# Patient Record
Sex: Male | Born: 1950 | Race: Black or African American | Hispanic: No | Smoking: Heavy tobacco smoker
Health system: Southern US, Community
[De-identification: ages and names within clinical notes are randomized; demographics above are authoritative.]

## PROBLEM LIST (undated history)

## (undated) DIAGNOSIS — M545 Low back pain, unspecified: Secondary | ICD-10-CM

## (undated) DIAGNOSIS — G8194 Hemiplegia, unspecified affecting left nondominant side: Secondary | ICD-10-CM

## (undated) DIAGNOSIS — G40909 Epilepsy, unspecified, not intractable, without status epilepticus: Secondary | ICD-10-CM

## (undated) DIAGNOSIS — N183 Chronic kidney disease, stage 3 unspecified: Secondary | ICD-10-CM

## (undated) DIAGNOSIS — M5380 Other specified dorsopathies, site unspecified: Secondary | ICD-10-CM

## (undated) DIAGNOSIS — I10 Essential (primary) hypertension: Secondary | ICD-10-CM

## (undated) DIAGNOSIS — E785 Hyperlipidemia, unspecified: Secondary | ICD-10-CM

## (undated) DIAGNOSIS — I639 Cerebral infarction, unspecified: Secondary | ICD-10-CM

## (undated) DIAGNOSIS — I4892 Unspecified atrial flutter: Secondary | ICD-10-CM

## (undated) HISTORY — DX: Low back pain: M54.5

## (undated) HISTORY — DX: Epilepsy, unspecified, not intractable, without status epilepticus: G40.909

## (undated) HISTORY — DX: Other specified dorsopathies, site unspecified: M53.80

## (undated) HISTORY — DX: Hyperlipidemia, unspecified: E78.5

## (undated) HISTORY — DX: Hemiplegia, unspecified affecting left nondominant side: G81.94

## (undated) HISTORY — DX: Unspecified atrial flutter: I48.92

## (undated) HISTORY — DX: Low back pain, unspecified: M54.50

## (undated) HISTORY — DX: Chronic kidney disease, stage 3 (moderate): N18.3

## (undated) HISTORY — DX: Chronic kidney disease, stage 3 unspecified: N18.30

---

## 2005-11-10 ENCOUNTER — Inpatient Hospital Stay (HOSPITAL_COMMUNITY): Admission: EM | Admit: 2005-11-10 | Discharge: 2005-11-14 | Payer: Self-pay | Admitting: Emergency Medicine

## 2005-11-11 ENCOUNTER — Ambulatory Visit: Payer: Self-pay | Admitting: Cardiology

## 2005-11-16 ENCOUNTER — Encounter (HOSPITAL_COMMUNITY): Admission: RE | Admit: 2005-11-16 | Discharge: 2005-12-16 | Payer: Self-pay | Admitting: Family Medicine

## 2008-10-02 ENCOUNTER — Inpatient Hospital Stay (HOSPITAL_COMMUNITY): Admission: EM | Admit: 2008-10-02 | Discharge: 2008-10-06 | Payer: Self-pay | Admitting: Emergency Medicine

## 2008-10-04 ENCOUNTER — Encounter (INDEPENDENT_AMBULATORY_CARE_PROVIDER_SITE_OTHER): Payer: Self-pay | Admitting: Cardiology

## 2009-04-11 ENCOUNTER — Emergency Department (HOSPITAL_COMMUNITY): Admission: EM | Admit: 2009-04-11 | Discharge: 2009-04-12 | Payer: Self-pay | Admitting: Emergency Medicine

## 2010-03-11 ENCOUNTER — Ambulatory Visit: Payer: Self-pay

## 2010-04-01 LAB — DIFFERENTIAL
Basophils Absolute: 0 10*3/uL (ref 0.0–0.1)
Basophils Relative: 0 % (ref 0–1)
Eosinophils Absolute: 0 10*3/uL (ref 0.0–0.7)
Monocytes Absolute: 0.6 10*3/uL (ref 0.1–1.0)
Monocytes Relative: 4 % (ref 3–12)
Neutro Abs: 14.1 10*3/uL — ABNORMAL HIGH (ref 1.7–7.7)
Neutrophils Relative %: 91 % — ABNORMAL HIGH (ref 43–77)

## 2010-04-01 LAB — CULTURE, BLOOD (ROUTINE X 2)
Culture: NO GROWTH
Culture: NO GROWTH
Report Status: 4062011
Report Status: 4062011

## 2010-04-01 LAB — CBC
Hemoglobin: 11.9 g/dL — ABNORMAL LOW (ref 13.0–17.0)
MCHC: 34.4 g/dL (ref 30.0–36.0)
MCV: 90.3 fL (ref 78.0–100.0)
RBC: 3.82 MIL/uL — ABNORMAL LOW (ref 4.22–5.81)
RDW: 15.6 % — ABNORMAL HIGH (ref 11.5–15.5)

## 2010-04-01 LAB — BASIC METABOLIC PANEL
CO2: 23 mEq/L (ref 19–32)
Calcium: 9.2 mg/dL (ref 8.4–10.5)
Chloride: 107 mEq/L (ref 96–112)
Creatinine, Ser: 1.52 mg/dL — ABNORMAL HIGH (ref 0.4–1.5)
GFR calc Af Amer: 57 mL/min — ABNORMAL LOW (ref >60)
Glucose, Bld: 168 mg/dL — ABNORMAL HIGH (ref 70–99)

## 2010-04-01 LAB — URINALYSIS, ROUTINE W REFLEX MICROSCOPIC
Bilirubin Urine: NEGATIVE
Glucose, UA: NEGATIVE mg/dL
Specific Gravity, Urine: >1.03 — ABNORMAL HIGH (ref 1.005–1.030)

## 2010-04-01 LAB — URINE MICROSCOPIC-ADD ON

## 2010-04-01 LAB — URINE CULTURE

## 2010-04-17 LAB — CBC
HCT: 43.3 % (ref 39.0–52.0)
Hemoglobin: 12.6 g/dL — ABNORMAL LOW (ref 13.0–17.0)
Hemoglobin: 13.1 g/dL (ref 13.0–17.0)
Hemoglobin: 14.6 g/dL (ref 13.0–17.0)
MCHC: 34.3 g/dL (ref 30.0–36.0)
MCHC: 34.5 g/dL (ref 30.0–36.0)
MCV: 94.5 fL (ref 78.0–100.0)
MCV: 94.9 fL (ref 78.0–100.0)
RBC: 3.87 MIL/uL — ABNORMAL LOW (ref 4.22–5.81)
RBC: 4.03 MIL/uL — ABNORMAL LOW (ref 4.22–5.81)
RDW: 14.5 % (ref 11.5–15.5)
WBC: 11.3 10*3/uL — ABNORMAL HIGH (ref 4.0–10.5)
WBC: 12.4 10*3/uL — ABNORMAL HIGH (ref 4.0–10.5)

## 2010-04-17 LAB — BRAIN NATRIURETIC PEPTIDE: Pro B Natriuretic peptide (BNP): 483 pg/mL — ABNORMAL HIGH (ref 0.0–100.0)

## 2010-04-17 LAB — LIPID PANEL
HDL: 40 mg/dL (ref >39)
Total CHOL/HDL Ratio: 4.5 RATIO
Triglycerides: 81 mg/dL (ref <150)
VLDL: 16 mg/dL (ref 0–40)

## 2010-04-17 LAB — URINE CULTURE: Culture: NO GROWTH

## 2010-04-17 LAB — POCT CARDIAC MARKERS
CKMB, poc: 3 ng/mL (ref 1.0–8.0)
Myoglobin, poc: 92 ng/mL (ref 12–200)
Troponin i, poc: <0.05 ng/mL (ref 0.00–0.09)
Troponin i, poc: <0.05 ng/mL (ref 0.00–0.09)

## 2010-04-17 LAB — PROTIME-INR
INR: 1 (ref 0.00–1.49)
Prothrombin Time: 13 seconds (ref 11.6–15.2)

## 2010-04-17 LAB — BASIC METABOLIC PANEL
CO2: 23 mEq/L (ref 19–32)
CO2: 24 mEq/L (ref 19–32)
Calcium: 8.9 mg/dL (ref 8.4–10.5)
Calcium: 9.1 mg/dL (ref 8.4–10.5)
Chloride: 106 mEq/L (ref 96–112)
Chloride: 111 mEq/L (ref 96–112)
Creatinine, Ser: 1.25 mg/dL (ref 0.4–1.5)
Creatinine, Ser: 1.47 mg/dL (ref 0.4–1.5)
GFR calc Af Amer: 60 mL/min — ABNORMAL LOW (ref >60)
GFR calc Af Amer: >60 mL/min (ref >60)
GFR calc non Af Amer: 49 mL/min — ABNORMAL LOW (ref >60)
GFR calc non Af Amer: 59 mL/min — ABNORMAL LOW (ref >60)
GFR calc non Af Amer: 59 mL/min — ABNORMAL LOW (ref >60)
Glucose, Bld: 83 mg/dL (ref 70–99)
Potassium: 4.3 mEq/L (ref 3.5–5.1)
Sodium: 139 mEq/L (ref 135–145)
Sodium: 140 mEq/L (ref 135–145)
Sodium: 141 mEq/L (ref 135–145)

## 2010-04-17 LAB — URINALYSIS, ROUTINE W REFLEX MICROSCOPIC
Bilirubin Urine: NEGATIVE
Glucose, UA: NEGATIVE mg/dL
Hgb urine dipstick: NEGATIVE
Ketones, ur: NEGATIVE mg/dL
Ketones, ur: NEGATIVE mg/dL
Protein, ur: NEGATIVE mg/dL
Protein, ur: NEGATIVE mg/dL
Specific Gravity, Urine: 1.02 (ref 1.005–1.030)
Urobilinogen, UA: 0.2 mg/dL (ref 0.0–1.0)
Urobilinogen, UA: 1 mg/dL (ref 0.0–1.0)

## 2010-04-17 LAB — CK TOTAL AND CKMB (NOT AT ARMC)
CK, MB: 3.6 ng/mL (ref 0.3–4.0)
Relative Index: INVALID (ref 0.0–2.5)
Total CK: 69 U/L (ref 7–232)

## 2010-04-17 LAB — CARDIAC PANEL(CRET KIN+CKTOT+MB+TROPI)
CK, MB: 11 ng/mL — ABNORMAL HIGH (ref 0.3–4.0)
CK, MB: 3.2 ng/mL (ref 0.3–4.0)
CK, MB: 6.9 ng/mL — ABNORMAL HIGH (ref 0.3–4.0)
CK, MB: 7.3 ng/mL — ABNORMAL HIGH (ref 0.3–4.0)
Relative Index: INVALID (ref 0.0–2.5)
Relative Index: INVALID (ref 0.0–2.5)
Relative Index: INVALID (ref 0.0–2.5)
Total CK: 70 U/L (ref 7–232)
Total CK: 70 U/L (ref 7–232)
Total CK: 74 U/L (ref 7–232)
Total CK: 74 U/L (ref 7–232)
Troponin I: 0.03 ng/mL (ref 0.00–0.06)
Troponin I: 0.03 ng/mL (ref 0.00–0.06)
Troponin I: 0.16 ng/mL — ABNORMAL HIGH (ref 0.00–0.06)
Troponin I: 0.37 ng/mL — ABNORMAL HIGH (ref 0.00–0.06)

## 2010-04-17 LAB — COMPREHENSIVE METABOLIC PANEL
ALT: 20 U/L (ref 0–53)
AST: 19 U/L (ref 0–37)
Albumin: 3.4 g/dL — ABNORMAL LOW (ref 3.5–5.2)
CO2: 28 mEq/L (ref 19–32)
Calcium: 9.4 mg/dL (ref 8.4–10.5)
Chloride: 106 mEq/L (ref 96–112)
GFR calc Af Amer: >60 mL/min (ref >60)
GFR calc non Af Amer: >60 mL/min (ref >60)
Sodium: 140 mEq/L (ref 135–145)

## 2010-04-17 LAB — DIFFERENTIAL
Eosinophils Absolute: 0.3 10*3/uL (ref 0.0–0.7)
Lymphocytes Relative: 19 % (ref 12–46)
Lymphs Abs: 2.3 10*3/uL (ref 0.7–4.0)
Monocytes Absolute: 1 10*3/uL (ref 0.1–1.0)
Monocytes Relative: 8 % (ref 3–12)
Neutro Abs: 8.5 10*3/uL — ABNORMAL HIGH (ref 1.7–7.7)
Neutrophils Relative %: 70 % (ref 43–77)

## 2010-04-17 LAB — URINE MICROSCOPIC-ADD ON

## 2010-05-29 NOTE — H&P (Signed)
Brent Acosta, RUSS NO.:  [id number]   MEDICAL RECORD NO.:  [id number]          Brent Acosta TYPE:  INP   LOCATION:  A214                          FACILITY:  [facility]   PHYSICIAN:  Osvaldo Shipper, MD     DATE OF BIRTH:  [birth year]   DATE OF ADMISSION:  11/10/2005  DATE OF DISCHARGE:  LH                                HISTORY & PHYSICAL   PRIMARY CARE PHYSICIAN:  Dr. Jorene Guest at [facility].   ADMISSION DIAGNOSES:  1. 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  2. History of hypertension.  3. History of dyslipidemia.  4. Past history of herpes zoster.   CHIEF COMPLAINT:  Right-sided weakness since yesterday.   HISTORY OF PRESENT ILLNESS:  Brent Acosta is a 60 year old African-American male  who has a history of hypertension, dyslipidemia, who was doing well until  yesterday when he suddenly felt weak on his right side.  He was also  experiencing some numbness on the right side, both upper and lower  extremity.  Did not have any dysphagia.  Did not have any slurred speech.  Did not have any kind of trauma.  No syncopal episodes.  No chest pain,  shortness of breath, nausea, vomiting.  He has been having diarrhea for the  past few months, about 3-4 bowel movements, which are watery in consistency  every day.  Denied any blood in the stool.  He denies similar symptoms of  weakness in the past.  He says over the past day, he has been having  difficulty with walking.  He is able to walk, but he found himself losing  balance.   HOME MEDICATIONS:  He is not sure of his medications, he takes atenolol,  unknown dose, and he is on other blood pressure pill.  He is also on a  cholesterol pill.   ALLERGIES:  No known drug allergies.   PAST MEDICAL HISTORY:  1. Hypertension.  2. Dyslipidemia.  3. History of herpes zoster in the past involving the right chest and      back.  4. History of colectomy for  diverticulosis in the past.   SOCIAL HISTORY:  He lives in [place] with a friend and his wife.  Occasional alcohol use, not on a daily basis.  Smokes a half pack of  cigarettes per day.  No illicit drug use.  Works in a Retail buyer.  Until yesterday, he was independent with his daily activities.   FAMILY HISTORY:  Significant for heart disease, strokes in the father and  mother.  One other brother has heart disease and high blood pressure.   REVIEW OF SYSTEMS:  GENERAL:  Unremarkable.  CARDIOVASCULAR:  Please see  HPI.  RESPIRATORY:  Unremarkable.  GI:  Unremarkable.  GU:  Unremarkable.  NEUROLOGIC:  Please see HPI.  MUSCULOSKELETAL:  Unremarkable.  ENDOCRINE:  Unremarkable.  The rest of the review of systems unremarkable.   PHYSICAL EXAMINATION:  VITAL SIGNS:  Temperature was 97.3, blood pressure on  presentation was 170/99,  then it got controlled in the 150s/80s.  Heart rate  in the 60s.  Respiratory rate about 18.  Saturation 99% on room air.  GENERAL:  An overweight individual in no distress.  HEENT:  There is no pallor, no icterus.  Oral mucous membranes are moist.  No oral lesions are noted.  NECK:  No thyromegaly is present.  Neck is soft and supple.  LUNGS:  Clear to auscultation bilaterally.  CARDIOVASCULAR:  S1 and S2 is normal, regular.  No murmurs appreciated.  No  carotid bruits heard.  No JVD seen.  No S3 or S4.  ABDOMEN:  Soft, nontender, nondistended.  Bowel sounds are present.  No mass  or organomegaly appreciated.  EXTREMITIES:  Without edema.  Peripheral pulses are palpable.  NEUROLOGIC:  No cranial deficits are present.  He definitely has motor  strength weakness on the right side, 4/5 upper and lower.  Reflexes are  sluggish on the right side as well.  Cerebellar signs also show mild  deficits on the right side.  Pronator drift was present.  Sensory exam was  difficult due to Brent Acosta cooperation.  Brent Acosta was alert and oriented x3.  Gait was not  assessed at this time.   LAB DATA:  White count is 11.2 with slight neutrophilia, otherwise CBC is  unremarkable.  CMET reveals alk phos of 38, albumin 2.3, otherwise  unremarkable.  One set of cardiac markers unremarkable.   EKG shows sinus rhythm with a left axis deviation.  Intervals appear to be  in the normal range.  There is T inversion noted in I aVL, V5, V6, possible  changes of earlier repolarization also noted.  No Q waves appreciated.   IMAGING STUDIES:  Brent Acosta had a chest x-ray which was unremarkable.   He had a CT head.  There were two white matter hypodensities, probably  related to chronic white matter ischemic changes noted.  The chronic small  vessel white matter changes were noted otherwise.  Right frontal sinusitis  was appreciated.   Subsequent to this and because of the Brent Acosta's weakness, he underwent MRI,  which showed 1 cm DWI hyperintensity lesion in the lateral aspect of the  left thalamus and the posterior limb of the left internal capsule.  Thought  to be acute ischemic infarct.  Again, white matter changes were noted.  A  possible demyelinating process was also noted.  There was a 6 mm cystic  entity in the left parasagittal anterior costal corpus callosum also noted.  Moderate sinusitis changes in the ethmoids and the right frontal sinus  noted.  MRA of the brain was done which showed focal increased caliber of  the left posterior cerebral artery with 50% narrowing.  A few other  irregularities were noted.  No aneurysms were detected on this MRA.  Mild  stenosis in the left ventricular artery.  Vertebral artery origin was noted,  possible 50% stenosis on the right vertebral basal junction noted.  He also  had an MRA of the neck, which mentioned the above changes.   IMPRESSION:  This is a 60 year old African-American male with hypertension  and dyslipidemia, who presents with right-sided weakness and is found to have left-sided stroke.  His risk factors  included smoking, his high blood  pressure and family history.  He also has EKG abnormalities.   PLAN:  1. Acute cerebrovascular accident:  We will admit the Brent Acosta to telemetry      to rule out any arrhythmias.  We will start  him on aspirin, put him on      statin, control his blood pressure with the help of oral      antihypertensive agents.  If need be, IV parenteral agents will be      used.  Homocysteine, ESR, RPR will be checked.  B12/folate level will      be checked.  A neurology consultation will be obtained.  A 2D echo and      carotid Dopplers will also be obtained.  Because of his history of      herpes zoster in the past, I will consent and check him for HIV as      well.  Further management will be deferred to the neurologist.  PT/OT      will also be obtained.  2. Abnormal electrocardiogram, somewhat concerning:  This change could be      related to high blood pressure; however, I think he probably merits an      outpatient cardiac workup once his acute stroke has passed.  We will      probably rule him out for acute coronary syndrome.  We will also get an      echocardiogram, as mentioned above.  3. Hypertension:  Continue atenolol, start him on hydrochlorothiazide and      then further adjust as needed.  4. Lipid profile will be checked.  5. Urine drug screen will be checked.  6. DVT/GI prophylaxis will be initiated.   Further management decision will be based on the results of initial testing  and Brent Acosta's response to treatment.      Osvaldo Shipper, MD  Electronically Signed     GK/MEDQ  D:  11/10/2005  T:  11/10/2005  Job:  [id number]   cc:   Darleen Crocker A. Gerilyn Pilgrim, M.D.  Fax: [phone]

## 2010-05-29 NOTE — Discharge Summary (Signed)
Brent Acosta, Brent Acosta                  ACCOUNT NO.:  [id number]   MEDICAL RECORD NO.:  [id number]          PATIENT TYPE:  INP   LOCATION:  A214                          FACILITY:  [facility]   PHYSICIAN:  Michaelyn Barter, M.D. DATE OF BIRTH:  March 22, 1950   DATE OF ADMISSION:  11/10/2005  DATE OF DISCHARGE:  11/04/2007LH                               DISCHARGE SUMMARY   FINAL DIAGNOSES:  1. Left middle cerebral artery infarct.  2. Uncontrolled hypertension.  3. Hypokalemia.  4. Hypercholesterolemia.  5. Right arm and leg weakness, acute onset.   PROCEDURES:  1. MRI/MRA of the head completed November 10, 2005.  2. Chest x-ray completed November 10, 2005.  3. CT scan of the head without contrast on November 10, 2005.  4. MRI of the brain without contrast November 10, 2005.  5. Carotid ultrasound completed November 11, 2005.  6. Echocardiogram  completed November 11, 2005, by [facility].   CONSULTATION:  Neurology with Dr. Darleen Crocker A. [place].   HISTORY OF PRESENT ILLNESS:  Mr. Callegari is a 60 year old gentleman who  arrived with a chief complaint right-sided weakness since the day prior  to this admission.  He also complained of some numbness on the right  side involving both his upper and lower extremity.  In addition, he  complained of some diarrhea that has occurred over the past few months  consisting of approximately 3-4 bowel movements per day, watery and  consistent.  He came to the hospital for evaluation.   PAST MEDICAL HISTORY:  Please see that dictated by Osvaldo Shipper, MD.   PROBLEM #1 - LEFT ACUTE MCA INFARCT:  On November 10, 2005, the patient  had an MRI of his brain completed with and without contrast.  The  patient was noted to have had approximately 1 cm diffusion weighted  hyperintensity in the lateral aspect of the left thalamus and posterior  limb of the left internal capsule which was probably an ischemic infarct  per the radiologist.  Nonspecific subcortical white  matter changes  supratentorially, possibly representing areas of ischemic gliosis  related to small vessel disease due to hypertension or diabetes with the  possibility of demyelinating process also should be considered in the  differential.  There was also noted to be an approximately 6 mm cystic  entity in the left parasagittal anterior corpus callosum which could  represent an ischemic infarct or necrotic plaque.  Moderate sinusitis  changes were also noted in the right frontal sinus.  MRA of the head  without contrast was completed on the same date which suggested mild  stenosis at the origin of the left vertebral artery, common origin of  the common carotid artery from the innominate artery which was probably  a development of variation.  Suspicion of approximately 50% stenosis of  the right vertebrobasilar junction was also noted.  A CT scan of the  patient's head without contrast was completed on November 10, 2005.  This  revealed cardiomegaly with mild bronchitic changes.  Fullness of the  right hilar region was also noted.  Two right white matter  hypodensities  probably related to chronic small vessel white matter ischemic changes  were seen as well as chronic small vessel white matter ischemic changes.  Ultrasounds of the patient's carotids were done bilaterally.  Minimal  plaque formation was noted in the right carotid bulb.  No evidence of  hemodynamic significant stenosis was seen.  Neurology was consulted. Dr.  Darleen Crocker A. [place] saw the patient.  His assessment was that the patient  had hemiparesis due to lacunar infarct involving the internal capsule  thalamic region on the left, extensive white matter disease was noted,  most likely due to chronic ischemia from chronic hypertension.  Over the  course of the patient's hospitalization, he indicated that his symptoms  were improving.  His right arm and leg strength improved, although they  never completely made it back to  baseline.  Physical therapy was  consulted and they helped to provide the patient with exercises to help  regain  his strength.   PROBLEM #2 -  HYPERTENSION:  This remained uncontrolled during the  earlier portion of his hospitalization.  Medication adjustments were  made to try to optimize control of the patient's blood pressure.   PROBLEM #3 -  HYPERCHOLESTEROLEMIA:  The patient had a fasting lipid  profile completed on November 11, 2005.  The cholesterol level was noted  to be 225, triglycerides 107, HDL 59, LDL 145.  The patient has been  started on Zocor 40 mg daily.   PROBLEM #4 -  HYPOKALEMIA:  The patient's potassium level was noted to  be 3.2 on November 11, 2005.  He has received supplementation for this  and this condition has improved.   CONDITION ON DISCHARGE:  The patient indicates that he is feeling better  and he requests to be discharged home today.   VITAL SIGNS:  Temperature 98.3, heart rate 64, respirations 22, blood  pressure 161/92.   LABORATORY DATA:  Sodium 138, potassium 4, chloride 103, CO2 29, BUN 19,  creatinine 1, glucose 100.   The decision has been made to discharge the patient from the hospital.   DISCHARGE MEDICATIONS:  The patient will be discharged home on:  1. Aspirin 325 mg p.o. daily.  2. Atenolol 50 mg p.o. daily.  3. Hydrochlorothiazide 50 mg p.o. daily.  4. Zocor 40 mg p.o. daily.   The patient will be told to follow up with his primary care physician  within the next 2-4 weeks.      Michaelyn Barter, M.D.  Electronically Signed     OR/MEDQ  D:  11/14/2005  T:  11/15/2005  Job:  [id number]

## 2010-05-29 NOTE — Procedures (Signed)
Brent Acosta, Brent Acosta                  ACCOUNT NO.:  [id number]   MEDICAL RECORD NO.:  [id number]          PATIENT TYPE:  INP   LOCATION:  A214                          FACILITY:  [facility]   PHYSICIAN:  Edward L. Juanetta Gosling, M.D.DATE OF BIRTH:  [birth year]   DATE OF PROCEDURE:  11/10/2005  DATE OF DISCHARGE:                                EKG INTERPRETATION   9:17, November 10, 2005.   The rhythm is sinus rhythm with a rate in the 60s.  There is probable left  atrial enlargement.  There is left axis deviation.  Small Q waves are seen  inferiorly, which could indicate a previous inferior infarction.  There are  larger Q wave in some of the leads.  ST-T wave abnormality is seen which is  most marked laterally, suggestive of ischemia.   Abnormal electrocardiogram.      Oneal Deputy. Juanetta Gosling, M.D.  Electronically Signed     ELH/MEDQ  D:  11/10/2005  T:  11/11/2005  Job:  [id number]

## 2010-05-29 NOTE — Procedures (Signed)
NAMEASHAZ, ROBLING NO.:  [id number]   MEDICAL RECORD NO.:  [id number]          PATIENT TYPE:  INP   LOCATION:  A214                          FACILITY:  [facility]   PHYSICIAN:  Gerrit Friends. Dietrich Pates, MD, FACCDATE OF BIRTH:  April 17, 1950   DATE OF PROCEDURE:  11/11/2005  DATE OF DISCHARGE:                                  ECHOCARDIOGRAM   REFERRING PHYSICIAN:  Dr. Rito Ehrlich and Dr. Dietrich Pates.   CLINICAL DATA:  A 60 year old gentleman with CVA.   M-MODE:  Aorta 2.9, left atrium 4.8, septum 1.6, posterior wall 1.2, LV  diastole 4.2, LV systole 3.0.   1. Technically adequate echocardiographic study.  2. Moderate left atrial enlargement; normal right atrial size; atrial      septum deviates to the right suggesting elevated left atrial pressure.  3. Normal right ventricular size and function; borderline RVH.  4. Normal mitral valve.  5. Normal tricuspid and pulmonic valves..  Mild dilatation of the proximal      pulmonary artery.  6. Minimal sclerosis of a trileaflet aortic valve.  7. Normal left ventricular size; mild to moderate hypertrophy with      disproportionate septal hypertrophy.  Normal regional and global LV      systolic function.  8. Normal IVC.      Gerrit Friends. Dietrich Pates, MD, [facility]  Electronically Signed     RMR/MEDQ  D:  11/12/2005  T:  11/12/2005  Job:  [id number]

## 2010-05-29 NOTE — Consult Note (Signed)
Brent Acosta, Brent Acosta                  ACCOUNT NO.:  [id number]   MEDICAL RECORD NO.:  [id number]          PATIENT TYPE:  INP   LOCATION:  A214                          FACILITY:  [facility]   PHYSICIAN:  Kofi A. Gerilyn Pilgrim, M.D. DATE OF BIRTH:  01/03/1951   DATE OF CONSULTATION:  11/10/2005  DATE OF DISCHARGE:                                   CONSULTATION   The patient is a 60 year old black male who presents with a right-sided  weakness, acute onset, for about a day. He has a long history of  hypertension.  No other associated  symptoms are reported.  No slurred  speech, head trauma, nausea, vomiting, vertigo,   PAST MEDICAL HISTORY:  1. Hypertension.  2. Dyslipidemia.  3. Herpes zoster involving the chest and back.  4. History of diverticular disease.   ALLERGIES:  None known.   HOME MEDICATIONS:  Unknown.   CURRENT MEDICATION PROFILE:  1. Atenolol.  2. Lovenox 40 mg daily.  3. Aspirin 81 mg  a day.  4. Zocor.  5. Protonix.  6. Albuterol.   SOCIAL HISTORY:  Lives in [place] with a friend and his wife.  Occasional alcohol use.  Smokes half a pack of cigarettes per day.  No  illicit drug use.  The patient works in the Retail buyer.   FAMILY HISTORY:  Significant for heart disease, stroke, hypertension, and  heart disease.   REVIEW OF SYSTEMS:  As stated in the history of present illness. Otherwise  unrevealing.   PHYSICAL EXAMINATION:  VITAL SIGNS:  Temperature 98.4, pulse 67,  respirations 20, blood pressure 161/95.  HEENT:  Neck is supple.  Head is normocephalic, atraumatic.  ABDOMEN:  Soft.  EXTREMITIES:  No edema.  NEUROMUSCULAR:  Mentation:  The patient is awake and alert.  Speech,  language and cognition are intact.  Cranial nerve evaluation:  Pupils equal,  round and reactive to light and accommodation.  Extraocular movements are  full.  Visual fields are intact.  Facial muscles symmetric. Tongue is  midline.  Uvula is midline.  Shoulder movement  is  normal.  Motor  examination shows 4 out of  5 weakness involving the right upper extremity  except for hand grip which is probably 3 out of  5.  The right leg is 4 out  of 5.  There is a pronator drift involving the right upper extremity.  Left  side shows normal tone, bulk and strength.  Coordination intact.  Sensation  normal to temperature and light touch.   LABORATORY DATA:  Brain MRI was reviewed. Impression was there appears to be  an old infarct involving the right basal ganglia. There may be other smaller  lacunar infarctions bilaterally involving the basal ganglia.  There is an  acute left internal capsule thalamic infarct.  There is significant and  extensive white matter, subcortical hyperintensity seen on the scleral  imaging.  Some of it is confluent  involving the anterior frontal horns  bilaterally.   ASSESSMENT:  1. 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  2. Extensive white matter disease.  This is most likely due to chronic      ischemia from chronic hypertension.  Other possibilities have been      raised such as multiple sclerosis and vasculitis but these other      possibilities are unlikely.   RECOMMENDATIONS:  The patient has extensive evaluation underway which I  agree with.  This will be followed.  I agree with aspirin for secondary  stroke prevention.  He was not taking this baseline.  Also physical therapy.   Thank you for this consultation.      Kofi A. Gerilyn Pilgrim, M.D.  Electronically Signed     KAD/MEDQ  D:  11/10/2005  T:  11/10/2005  Job:  [id number]

## 2011-01-15 ENCOUNTER — Ambulatory Visit (HOSPITAL_COMMUNITY)
Admission: RE | Admit: 2011-01-15 | Discharge: 2011-01-15 | Disposition: A | Discharge status: Home / Self Care | Payer: Medicaid Other | Source: Ambulatory Visit | Attending: Internal Medicine | Admitting: Internal Medicine

## 2011-01-15 ENCOUNTER — Encounter (HOSPITAL_COMMUNITY): Payer: Self-pay

## 2011-01-15 DIAGNOSIS — M79609 Pain in unspecified limb: Secondary | ICD-10-CM | POA: Insufficient documentation

## 2011-01-15 DIAGNOSIS — I10 Essential (primary) hypertension: Secondary | ICD-10-CM | POA: Insufficient documentation

## 2011-01-15 DIAGNOSIS — M6281 Muscle weakness (generalized): Secondary | ICD-10-CM | POA: Insufficient documentation

## 2011-01-15 DIAGNOSIS — M545 Low back pain, unspecified: Secondary | ICD-10-CM | POA: Insufficient documentation

## 2011-01-15 DIAGNOSIS — M5380 Other specified dorsopathies, site unspecified: Secondary | ICD-10-CM | POA: Insufficient documentation

## 2011-01-15 DIAGNOSIS — IMO0001 Reserved for inherently not codable concepts without codable children: Secondary | ICD-10-CM | POA: Insufficient documentation

## 2011-01-15 HISTORY — DX: Cerebral infarction, unspecified: I63.9

## 2011-01-15 HISTORY — DX: Essential (primary) hypertension: I10

## 2011-01-15 NOTE — Progress Notes (Addendum)
Physical Therapy Evaluation- Medicaid  Patient Details  Name: Brent Acosta MRN: [id number] Date of Birth: Apr 09, 1950  Today's Date: 01/15/2011 Time: 4098-1191 Time Calculation (min): 48 min Charges: I EV, 10 MHP  Visit#: 1  of 16   Re-eval: 02/14/11 Assessment Diagnosis: low back pain Next MD Visit: not sure  Past Medical History:  Past Medical History  Diagnosis Date  . 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 HTN (hypertension)   . Gout     big toe   Subjective Symptoms/Limitations Symptoms: Has had lower back problems > 5 years, worse in the past few months.  Pain radiates into left posterior thigh, stops at knee and occationally into right thigh stops at the knee.  PMH of L CVA with right sided deficits.   How long can you sit comfortably?: 10 mins How long can you stand comfortably?: 10 mins How long can you walk comfortably?: 2-3 mins.   Repetition: Increases Symptoms Pain Assessment Currently in Pain?: Yes Pain Score:   5 Pain Location: Back Pain Orientation: Left Pain Type: Chronic pain Pain Radiating Towards: left leg and at times right thigh Pain Onset: More than a month ago Pain Frequency: Intermittent Effect of Pain on Daily Activities: work- can't work at all, lifting boxes, bending over,  Assessment RLE Strength Right Hip Flexion: 4/5 Right Hip Extension: 4/5 Right Hip ABduction: 4/5 Right Hip ADduction: 4/5 Right Knee Flexion: 5/5 Right Knee Extension: 5/5 Right Ankle Dorsiflexion: 4/5 Right Ankle Plantar Flexion: 5/5 LLE Strength Left Hip Flexion: 5/5 Left Hip Extension: 4/5 Left Hip ABduction: 5/5 Left Hip ADduction: 4/5 Left Knee Flexion: 5/5 Left Knee Extension: 5/5 Left Ankle Dorsiflexion: 5/5 Left Ankle Plantar Flexion: 5/5 Lumbar AROM Lumbar Flexion: decreased 10% (pulling pain, most painful of all the motions.  ) Lumbar Extension: decreased 30% (painful pulling in low back and front of the thighs.) Lumbar - Right Side Bend: Decreased  25%  (pain in left side of low back) Lumbar - Left Side Bend: decreased 25%  (less pain going to the left than going to the right) Lumbar - Right Rotation: decreased 10% (minimal pain) Lumbar - Left Rotation: decreased 10%  (minimal pain, but more than right rotation.  ) Lumbar Strength Lumbar Flexion: 3-/5 Lumbar Extension: 3/5 (within available ROM.  )  Exercise/Treatments Stretches Active Hamstring Stretch: 3 reps;30 seconds Lower Trunk Rotation: 3 reps;5 reps;10 seconds Prone on Elbows Stretch: 3 reps;30 seconds;Limitations Prone on Elbows Stretch Limitations: one pillow Lumbar Exercises Stability Heel Squeeze: Prone;10 reps;5 seconds;Limitations Heel Squeeze Limitations: 1 pillow  Modalities Modalities: Moist Heat Moist Heat Therapy Number Minutes Moist Heat: 10 Minutes Moist Heat Location:  (low back in supine with knee pillow)  Physical Therapy Assessment and Plan  Clinical Impression Statement: This 61 y.o. male comes to OP PT for chronic low back pain that recently has gotten worse over the past 3 months with pain radiating into his left leg primarily down to his foot (posteriorly), and at times into his right thigh (avove the knee).  He has significant PMH for stroke leaving him with some right sided weakness that is premorbid.  He presents with decreased lumbar ROM, decreased core muscle strength, decreased lumbar flexiibility, decreased bil leg strength, and decreased activity tolerance as well as decreased knowlege of correct lifting techniques and other low back education.  He would benefit from skilled PT to maximize his independence, functional mobility and safety so that he may be able to [facility] return to  the working world.   Rehab Potential: Good PT Frequency: Min 2X/week PT Duration: 8 weeks PT Treatment/Interventions: Gait training;Stair training;Functional mobility training;Therapeutic activities;Therapeutic exercise;Balance training;Neuromuscular  re-education;Patient/family education;Other (comment) (modalities PRN for pain, massage and joint mobs PRN) PT Plan: Continue 2 xs/wk x 8 weeks (or as Medicaid approval allows) review HEP given (active hamstring, prone on 1 pillow heel squeezes, bridges, ab set, lower trunk rotation, prone prop on 1 pillow, and SK to C,).  Add in next treatment bike or treadmill for warm-up,  bent knee raises,  hip abduction either clams or straight leg,  supine clams, and increase bridges to 10 reps.  Continue to heat at the end of the session for pain rellief.     Goals Home Exercise Program Pt will Perform Home Exercise Program: Independently PT Short Term Goals Time to Complete Short Term Goals: 4 weeks PT Short Term Goal 1:  Increase Lumbar ROM by at least 10% throughout to increase functional mobility and decrease stiffness  PT Short Term Goal 2: Decrease average daily pain to less than or equal to 3/10 to improve QOL and tolerance of daily activities PT Short Term Goal 3: Increase lumbar flexion and extension strength to at least 3+/5 to show improved spinal stability  PT Long Term Goals Time to Complete Long Term Goals: 8 weeks PT Long Term Goal 1: Decrease average daily pain to 2/10 with max for the week 3/10 per patient report to show improved QOL and tolerance for functional activities.  PT Long Term Goal 2: Improve lumbar flexion and extension strength to at least 4/5 to show improved spinal stability.  Long Term Goal 3: Improve lumbar ROM to [facility] to show improved spinal mobiliy and flexibility.  Long Term Goal 4: Patient is able to demonstrate picking up of a 10# box from the floor with correct lifting technique 10 times to show improved strength and back safety awareness without pain so that he might be able to return to some kind of meaningful job.   Problem List Patient Active Problem List  Diagnoses  . Lumbago  . Limitation of joint motion of low back    PT - End of Session Activity  Tolerance: Patient tolerated treatment well General Behavior During Session: [facility] for tasks performed Cognition: [facility] for tasks performed PT Plan of Care PT Home Exercise Plan: see scanned report Consulted and Agree with Plan of Care: Patient   Rollene Rotunda. Carmina Walle, PT, DPT  01/15/2011, 8:00 PM  Physician Documentation Your signature is required to indicate approval of the treatment plan as stated above.  Please sign and either send electronically or make a copy of this report for your files and return this physician signed original.   Please mark one 1.__approve of plan  2. ___approve of plan with the following conditions.   ______________________________                                                          _____________________ Physician Signature  Date  

## 2011-01-19 ENCOUNTER — Ambulatory Visit (HOSPITAL_COMMUNITY): Payer: Medicaid Other | Admitting: Physical Therapy

## 2011-01-20 ENCOUNTER — Ambulatory Visit (HOSPITAL_COMMUNITY)
Admission: RE | Admit: 2011-01-20 | Discharge: 2011-01-20 | Disposition: A | Discharge status: Home / Self Care | Payer: Medicaid Other | Source: Ambulatory Visit | Attending: Family Medicine | Admitting: Family Medicine

## 2011-01-20 DIAGNOSIS — M5380 Other specified dorsopathies, site unspecified: Secondary | ICD-10-CM

## 2011-01-20 DIAGNOSIS — M545 Low back pain: Secondary | ICD-10-CM

## 2011-01-20 NOTE — Progress Notes (Signed)
Physical Therapy Treatment Patient Details  Name: Brent Acosta MRN: [id number] Date of Birth: August 07, 1950  Today's Date: 01/20/2011 Time: 4098-1191 Time Calculation (min): 37 min Charges: 37 TE Visit#: 2  of 16   Re-eval: 02/14/11    Subjective: Symptoms/Limitations Symptoms: Has been practicing his HEP.  No pain with those exercises.   Pain Assessment Currently in Pain?: No/denies Pain Score: 0-No pain Pain Location: Back Pain Orientation: Left  Exercise/Treatments Stretches Single Knee to Chest Stretch: 2 reps;30 seconds;Limitations Single Knee to Chest Stretch Limitations: bil Double Knee to Chest Stretch: 2 reps;30 seconds Lower Trunk Rotation: Limitations Lower Trunk Rotation Limitations: 10 times 5 second holds Prone on Elbows Stretch: 2 reps;30 seconds Prone on Elbows Stretch Limitations: no pillow Lumbar Exercises Stability Bridge: 10 reps Bent Knee Raise: 10 reps;2 seconds Ab Set: 10 reps;5 seconds;Limitations AB Set Limitations: ab set with heel slides bil x 10 each.   Straight Leg Raise: 10 reps;Supine;Limitations Straight Leg Raises Limitations: bil legs with ab set.  Hip Abduction: Side-lying;10 reps;Limitations Hip Abduction Limitations: bil Heel Squeeze: Prone;10 reps;5 seconds;Limitations Heel Squeeze Limitations: 1 pillow Leg Raise: Prone;10 reps;Limitations Leg Raises Limitations: 1 pillow Machine Exercises Tread Mill: 5' @ 1.2     Physical Therapy Assessment and Plan PT Assessment and Plan Clinical Impression Statement: Patient tolerated new exercises well and has already made gains with his HEP.  He stated that the heat did not really help last time, so we did not heat after the treatment session.   PT Plan: The patient has 2 more visits approved through Medicaid.  Start functional squats, wall slides with ab sets, and opposite arm leg in prone with 1 pillow.  Start lifting education from 12" high with empty box.      Problem List Patient Active  Problem List  Diagnoses  . Lumbago  . Limitation of joint motion of low back    [name] B. Leilanni Halvorson, PT, DPT  01/20/2011, 3:16 PM

## 2011-01-26 ENCOUNTER — Ambulatory Visit (HOSPITAL_COMMUNITY): Payer: Medicaid Other

## 2011-02-02 ENCOUNTER — Ambulatory Visit (HOSPITAL_COMMUNITY)
Admission: RE | Admit: 2011-02-02 | Discharge: 2011-02-02 | Disposition: A | Discharge status: Home / Self Care | Payer: Medicaid Other | Source: Ambulatory Visit | Attending: Family Medicine | Admitting: Family Medicine

## 2011-02-02 DIAGNOSIS — M545 Low back pain: Secondary | ICD-10-CM

## 2011-02-02 DIAGNOSIS — M5380 Other specified dorsopathies, site unspecified: Secondary | ICD-10-CM

## 2011-02-02 NOTE — Progress Notes (Signed)
Physical Therapy Treatment Patient Details  Name: Brent Acosta MRN: [id number] Date of Birth: 1950-08-10  Today's Date: 02/02/2011 Time: 6962-9528 Time Calculation (min): 44 min Charges: 8 TA, 36 TE Visit#: 3  of 16   Re-eval: 02/14/11    Subjective: Symptoms/Limitations Symptoms: Feels good today.  No pain Pain Assessment Currently in Pain?: No/denies Pain Score: 0-No pain Pain Location: Back Pain Orientation: Left  Exercise/Treatments Stretches Active Hamstring Stretch: 2 reps;30 seconds Single Knee to Chest Stretch: 2 reps;30 seconds;Limitations Single Knee to Chest Stretch Limitations: bil Double Knee to Chest Stretch: 2 reps;30 seconds Lower Trunk Rotation: 5 reps;10 seconds Lower Trunk Rotation Limitations: bil Prone on Elbows Stretch: 2 reps;30 seconds Prone on Elbows Stretch Limitations: no pillow Lumbar Exercises Stability Bridge: Limitations Bridge Limitations: 12 reps Bent Knee Raise: 15 reps AB Set Limitations: ab set with heel slides bil x 10 each.   Toe Tap: 10 reps Straight Leg Raise: 10 reps;Supine;Limitations Straight Leg Raises Limitations: bil legs with ab set. hovering Functional Squats: 10 reps Lifting: From floor;10 reps;Weights Lifting Weights (lbs): 15# box Wall Slides: 10 reps;2 seconds Machine Exercises Tread Mill: 6' @ 1.4  Physical Therapy Assessment and Plan  Clinical Impression Statement: The patient tolerated harder exercises without pain.   PT Plan: one more approved Medicaid visit.  Review lifting education, provide with new HEP (add prone hip extension)    Problem List Patient Active Problem List  Diagnoses  . Lumbago  . Limitation of joint motion of low back    PT - End of Session Activity Tolerance: Patient tolerated treatment well General Behavior During Session: [facility] for tasks performed Cognition: [facility] for tasks performed PT Plan of Care PT Home Exercise Plan: no new today.    Rollene Rotunda Mary Hockey, PT, DPT    02/02/2011, 1:48 PM

## 2011-02-09 ENCOUNTER — Ambulatory Visit (HOSPITAL_COMMUNITY)
Admission: RE | Admit: 2011-02-09 | Discharge: 2011-02-09 | Disposition: A | Discharge status: Home / Self Care | Payer: Medicaid Other | Source: Ambulatory Visit | Attending: Family Medicine | Admitting: Family Medicine

## 2011-02-09 DIAGNOSIS — M5380 Other specified dorsopathies, site unspecified: Secondary | ICD-10-CM

## 2011-02-09 DIAGNOSIS — M545 Low back pain: Secondary | ICD-10-CM

## 2011-02-09 NOTE — Progress Notes (Signed)
Physical Therapy Re-Evaluation/ Discharge Note  Patient Details  Name: Brent Acosta MRN: [id number] Date of Birth: 03-17-1950  Today's Date: 02/09/2011 Time: 8295-6213 Time Calculation (min): 52 min Charge: 1 re-eval, 10 TA, 27 TE Visit#: 4  of 16   Re-eval: 02/14/11    Past Medical History:  Past Medical History  Diagnosis Date  . 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 HTN (hypertension)   . Gout     big toe   Subjective Symptoms/Limitations Symptoms: Patient reports he feels "pretty good" today.   How long can you sit comfortably?: 5-10 mins How long can you stand comfortably?: 5 mins How long can you walk comfortably?: gets too SOB and fatigued when walking 5 mins.   Repetition: Increases Symptoms Pain Assessment Currently in Pain?: Yes Pain Score:   5 Pain Location: Back Pain Orientation: Left;Lower Pain Type: Chronic pain   Assessment: previous values ( ) RLE Strength Right Hip Flexion: 5/5 (4/5) Right Hip Extension: 4/5 (4/5) Right Hip ABduction: 5/5 (4/5) Right Knee Flexion: 5/5 (5/5) Right Knee Extension: 5/5 (5/5) Right Ankle Dorsiflexion: 5/5 (4/5) Right Ankle Plantar Flexion: 5/5 (5/5) LLE Strength Left Hip Flexion: 5/5 (5/5) Left Hip Extension: 4/5 (4/5) Left Hip ABduction: 5/5 (5/5) Left Knee Flexion: 5/5 (5/5) Left Knee Extension: 5/5 (5/5) Left Ankle Dorsiflexion: 5/5 (5/5) Left Ankle Plantar Flexion: 5/5 Lumbar AROM: previously filed values ( ) Lumbar Flexion: decreased 5% (pain at end ROM mid low back) (decreased 10%) Lumbar Extension: decreased 10% (extension more painful than flexion) (decreased 30%) Lumbar - Right Side Bend: WNL (painful on right side low back) (decreased 25%) Lumbar - Left Side Bend: WNL ("just a little bit" re: pain, not as painful as right SB) (decreased 25%) Lumbar - Right Rotation: WFL (decreased 10 %) Lumbar - Left Rotation: WFL (decreased 10%) Lumbar Strength Lumbar Flexion: 4/5 (3-/5) Lumbar Extension: 4/5 (3/5)    Exercise/Treatments Stretches Active Hamstring Stretch: 2 reps;30 seconds Single Knee to Chest Stretch: 2 reps;30 seconds;Limitations Single Knee to Chest Stretch Limitations: bil Double Knee to Chest Stretch: 2 reps;30 seconds Lower Trunk Rotation: 5 reps;10 seconds Lower Trunk Rotation Limitations: bil Prone on Elbows Stretch: 2 reps;60 seconds Prone on Elbows Stretch Limitations: no pillow Lumbar Exercises: Stability Bridge: Limitations Bridge Limitations: 12 reps Bent Knee Raise: 15 reps Toe Tap: 10 reps Straight Leg Raise: 10 reps;Supine;Limitations Straight Leg Raises Limitations: bil legs with ab set. hovering Hip Abduction: Side-lying;10 reps;Limitations Hip Abduction Limitations: bil Heel Squeeze: Prone;10 reps;5 seconds;Limitations Heel Squeeze Limitations: 1 pillow Leg Raise: Prone;10 reps;Limitations Leg Raises Limitations: 1 pillow Functional Squats: 10 reps Lifting: From floor;10 reps;Weights Lifting Weights (lbs): 15# box Wall Slides: 10 reps;2 seconds Machine Exercises Tread Mill: 6' @ 1.4  Physical Therapy Assessment and Plan Clinical Impression Statement: The patient was re-assess for d/c to HEP today.  He is stronger in both bil leg and core muscle strenght.  His low back mobility has improved throughout, but he continued to have daily pain.  He has met 3/4 STGs and 2/4 LTGs.   PT Plan: The patient has met all his medicaid approved visits.  Added prone with one pillow leg alternating SLRs to HEP.      Goals Home Exercise Program Pt will Perform Home Exercise Program: Independently PT Goal: Perform Home Exercise Program - Progress: Met PT Short Term Goals Time to Complete Short Term Goals: 4 weeks PT Short Term Goal 1: Increase Lumbar ROM by at least 10% throughout to  increase functional mobility and decrease stiffness  PT Short Term Goal 1 - Progress: Met PT Short Term Goal 2: Decrease average daily pain to less than or equal to 3/10 to improve QOL  and tolerance of daily activities  PT Short Term Goal 2 - Progress: Not met PT Short Term Goal 3: Increase lumbar flexion and extension strength to at least 3+/5 to show improved spinal stability  PT Short Term Goal 3 - Progress: Met PT Long Term Goals Time to Complete Long Term Goals: 8 weeks PT Long Term Goal 1: Decrease average daily pain to 2/10 with max for the week 3/10 per patient report to show improved QOL and tolerance for functional activities. PT Long Term Goal 1 - Progress: Not met PT Long Term Goal 2: Improve lumbar flexion and extension strength to at least 4/5 to show improved spinal stability PT Long Term Goal 2 - Progress: Met Long Term Goal 3: Improve lumbar ROM to [facility] to show improved spinal mobiliy and flexibility Long Term Goal 3 Progress: Not met Long Term Goal 4: Patient is able to demonstrate picking up of a 10# box from the floor with correct lifting technique 10 times to show improved strength and back safety awareness without pain so that he might be able to return to some kind of meaningful job Long Term Goal 4 Progress: Met  Problem List Patient Active Problem List  Diagnoses  . Lumbago  . Limitation of joint motion of low back    PT - End of Session Activity Tolerance: Patient tolerated treatment well General Behavior During Session: [facility] for tasks performed Cognition: [facility] for tasks performed PT Plan of Care PT Home Exercise Plan: Added prone on one pillow alternating SLRs.  See scanned report.   Consulted and Agree with Plan of Care: Patient  Rollene Rotunda. Filippo Puls, PT, DPT  02/09/2011, 2:13 PM  Physician Documentation Your signature is required to indicate approval of the treatment plan as stated above.  Please sign and either send electronically or make a copy of this report for your files and return this physician signed original.   Please mark one 1.__approve of plan  2. ___approve of plan with the following  conditions.   ______________________________                                                          _____________________ Physician Signature                                                                                                             Date

## 2011-04-02 IMAGING — CT CT HEAD W/O CM
1 of 2 series · 13 of 30 positions shown, 17 images · non-contrast
Comparison: 11/10/2005

CLINICAL DATA: Dizziness.  Lightheadedness.  History of strokes.

CT HEAD WITHOUT CONTRAST
TECHNIQUE: Contiguous axial images were obtained from the base of
the skull through the vertex without contrast.

[Series 2: brain · axial · 0.47mm/px · z∈[+168,+293]mm · 13 of 28 slices shown, 17 images]
[im 2/28  brain]
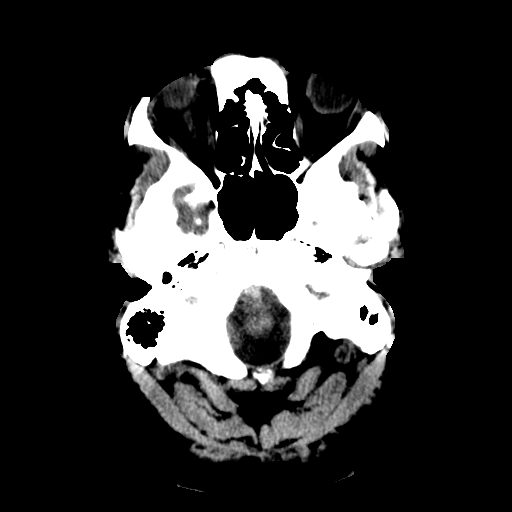
[im 2/28  bone]
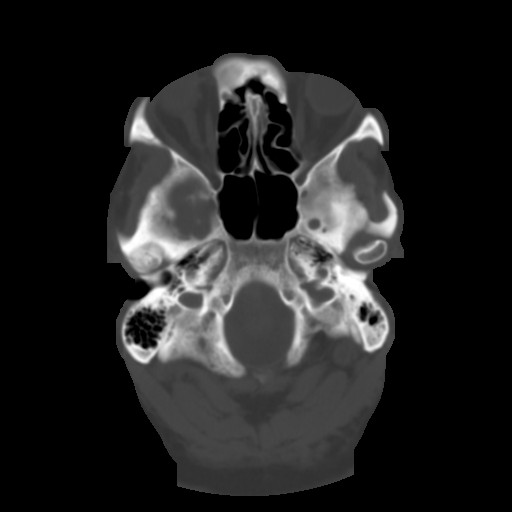
[im 4/28  brain]
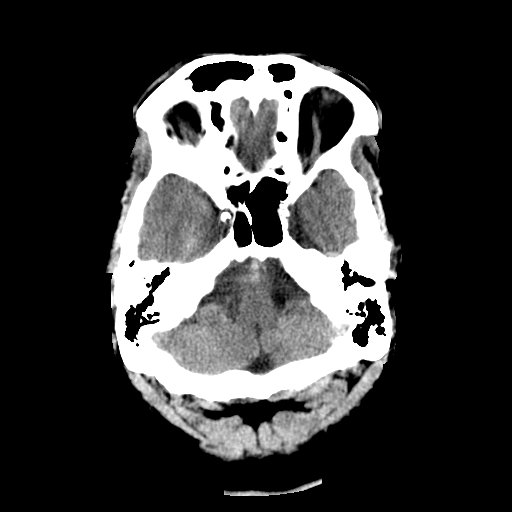
[im 6/28  brain]
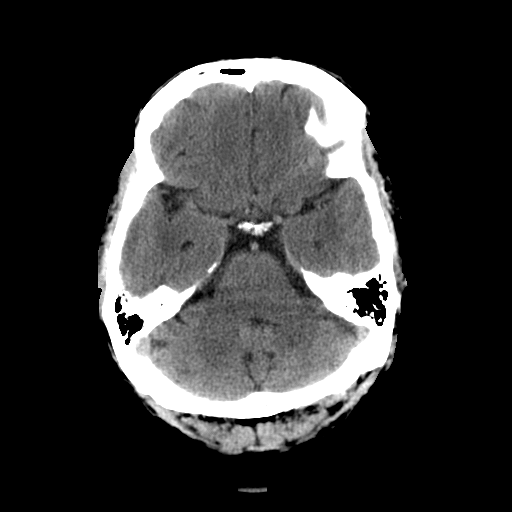
[im 8/28  brain]
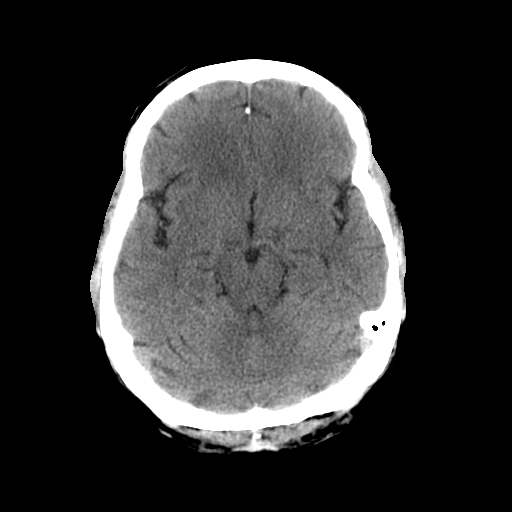
[im 10/28  brain]
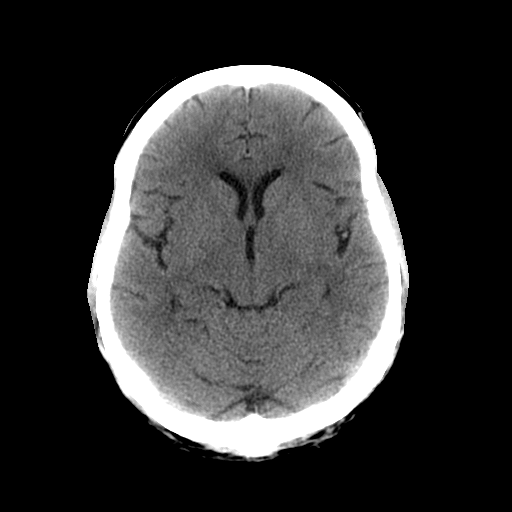
[im 10/28  bone]
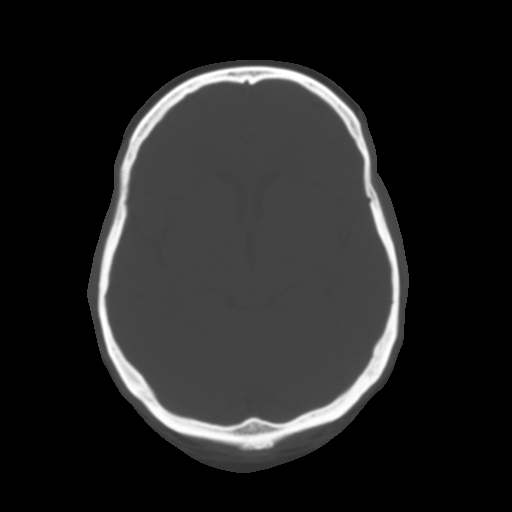
[im 12/28  brain]
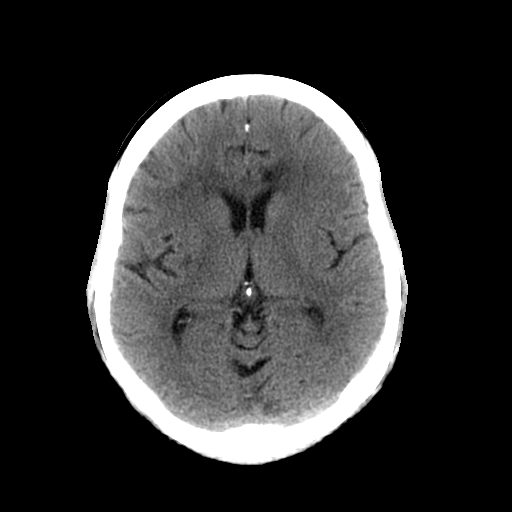
[im 14/28  brain]
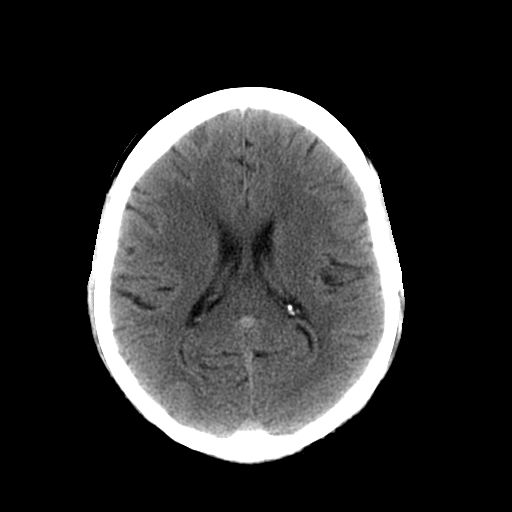
[im 16/28  brain]
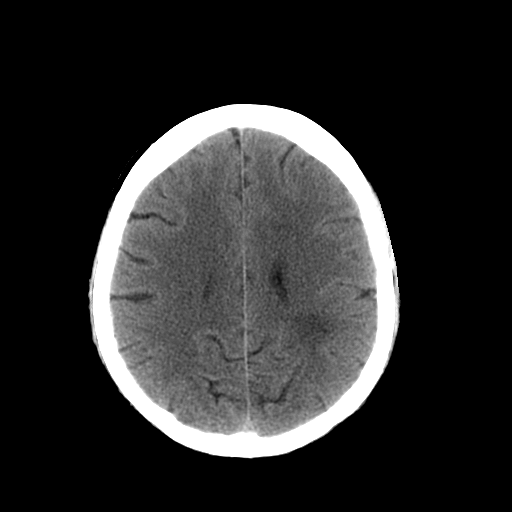
[im 18/28  brain]
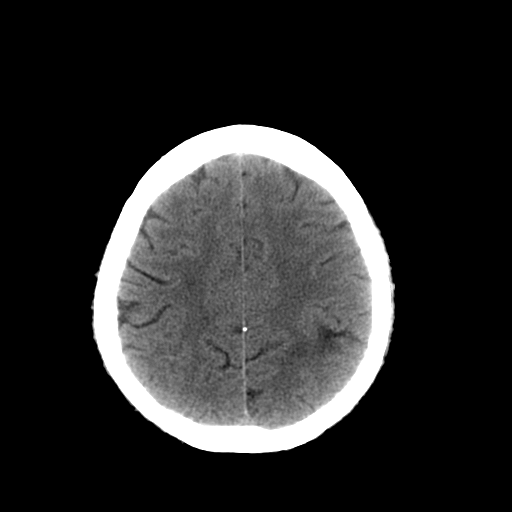
[im 18/28  bone]
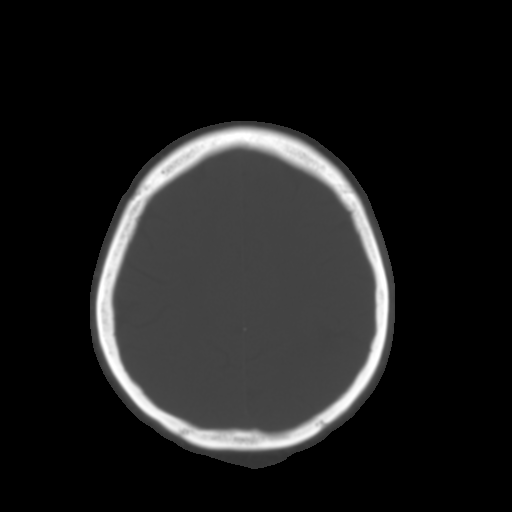
[im 20/28  brain]
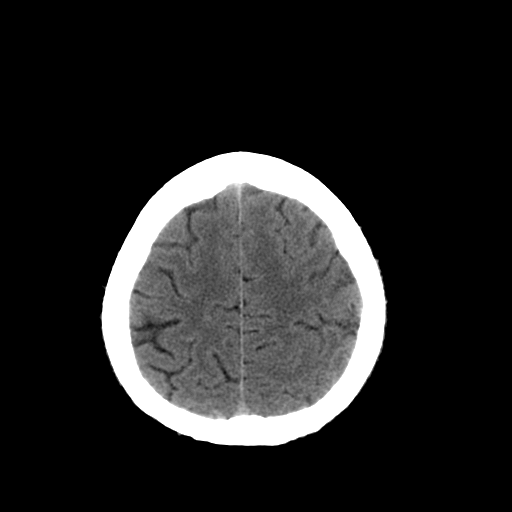
[im 22/28  brain]
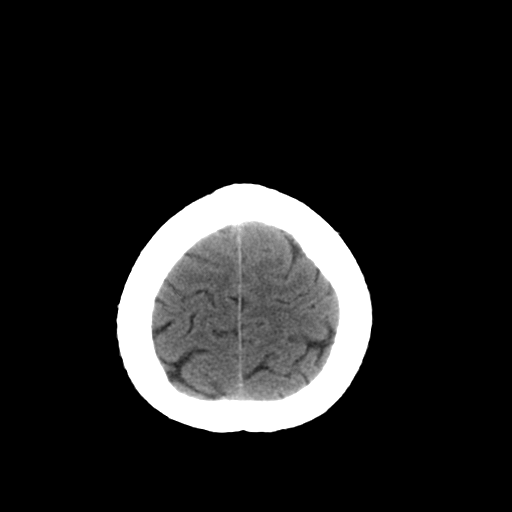
[im 24/28  brain]
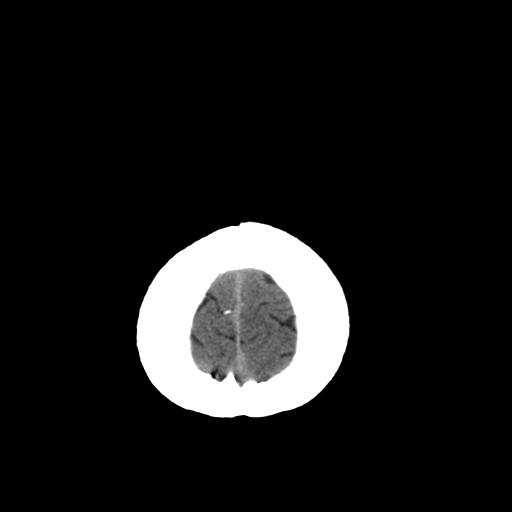
[im 26/28  brain]
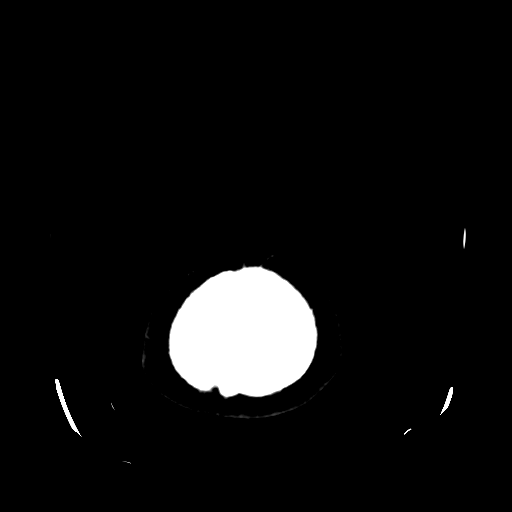
[im 26/28  bone]
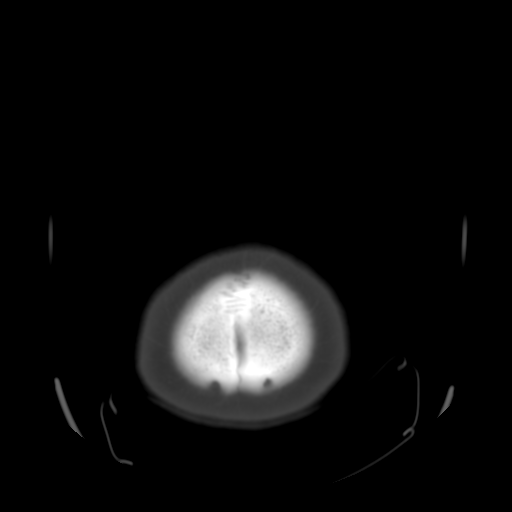

[13 of 30 positions shown; findings below may reference images not displayed]

FINDINGS: There is no acute intracranial hemorrhage, acute
infarction, or intracranial mass lesion.  The patient has multiple
old strokes including one in the left frontal lobe and one high in
the left parietal lobe, both new since 11/10/2005.  Basal ganglia
and right centrum semiovale infarcts are old and unchanged.

There is a small retention cyst in the base of the right sided
frontal sinus.  The other bony structures are normal.
IMPRESSION: Multiple old cerebral infarcts.  No acute abnormalities.

## 2011-04-02 IMAGING — CR DG CHEST 2V
2 series · 2 of 2 positions shown · non-contrast
Comparison: 11/10/2005

CLINICAL DATA: Dizziness/hypertension

CHEST - 2 VIEW

[w chest pa]
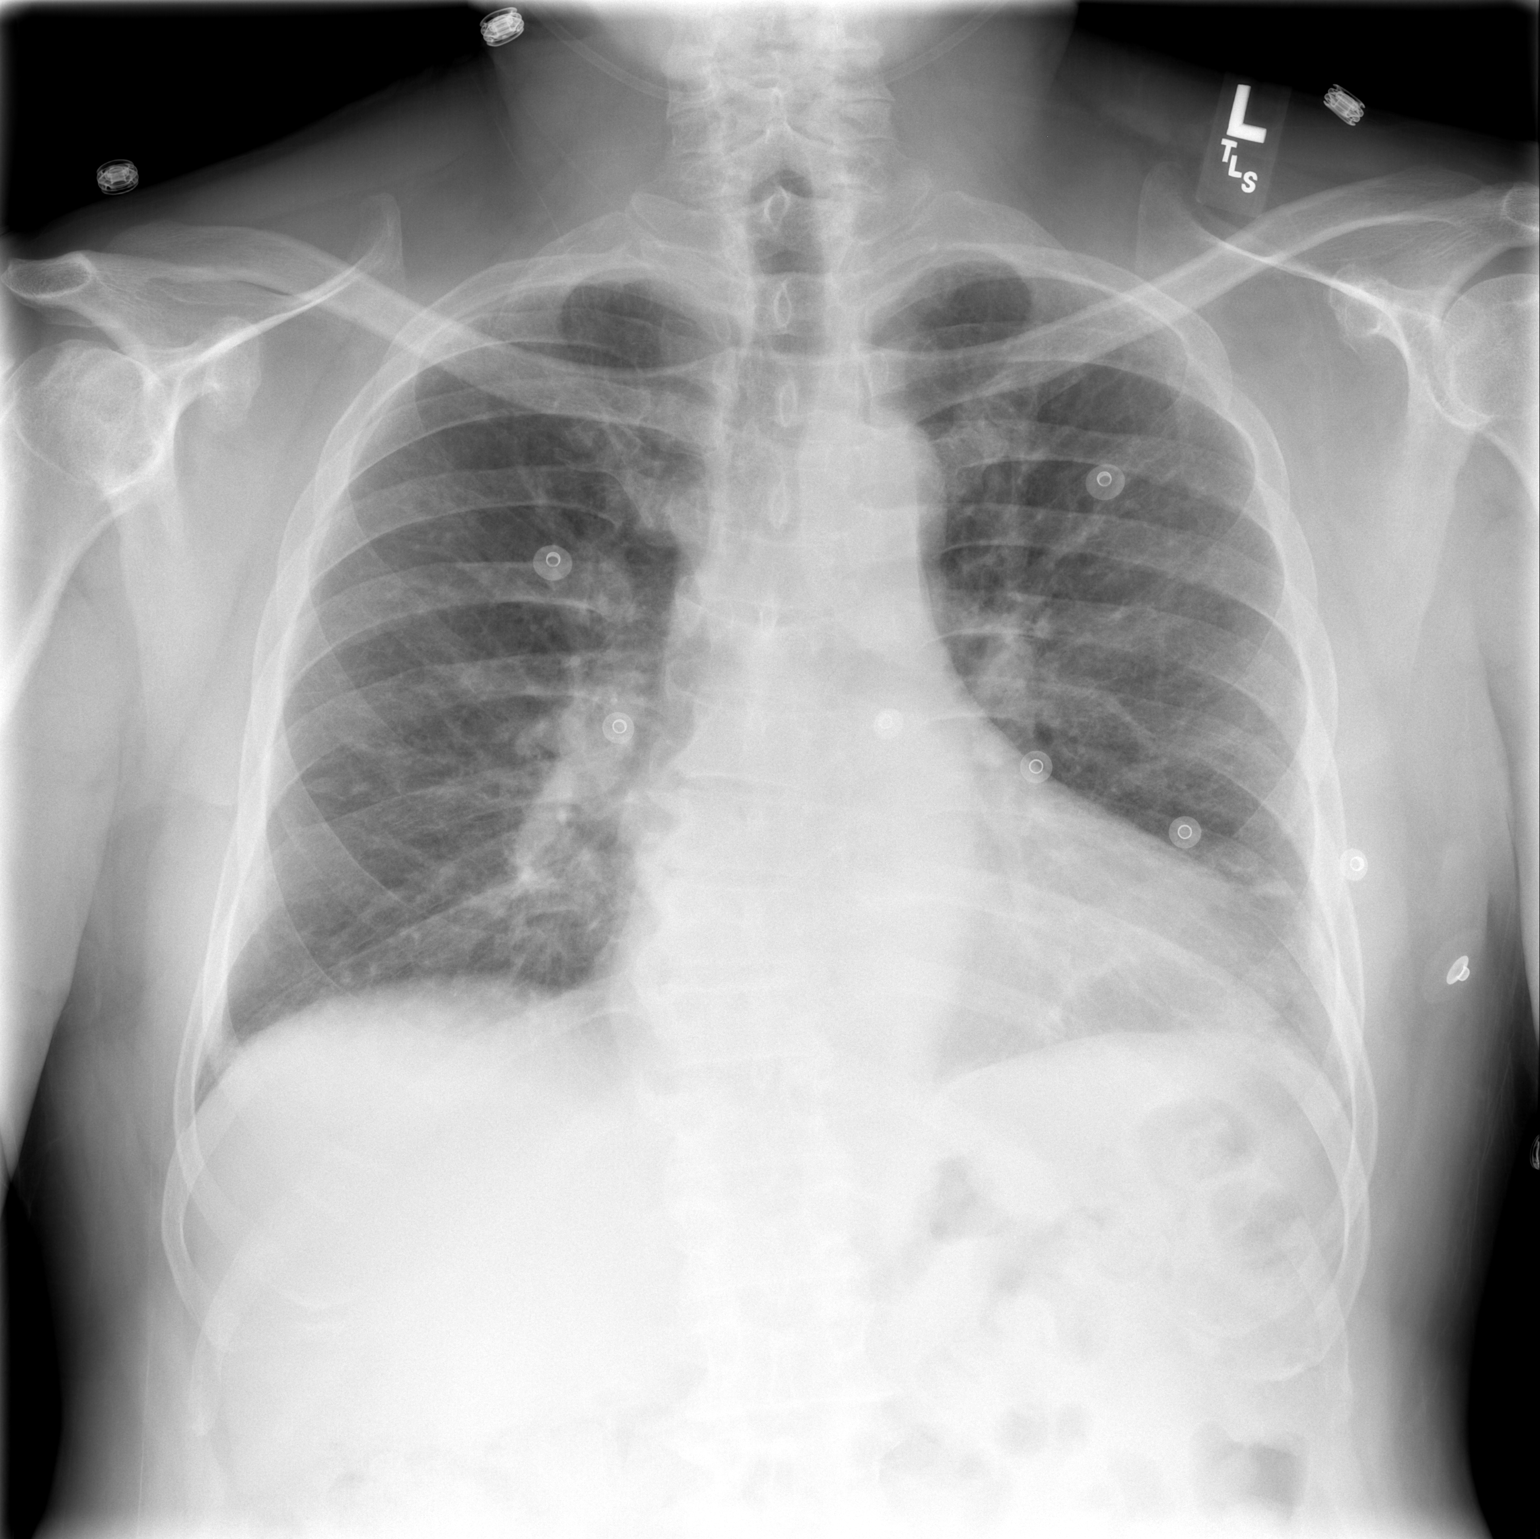

[w chest lat]
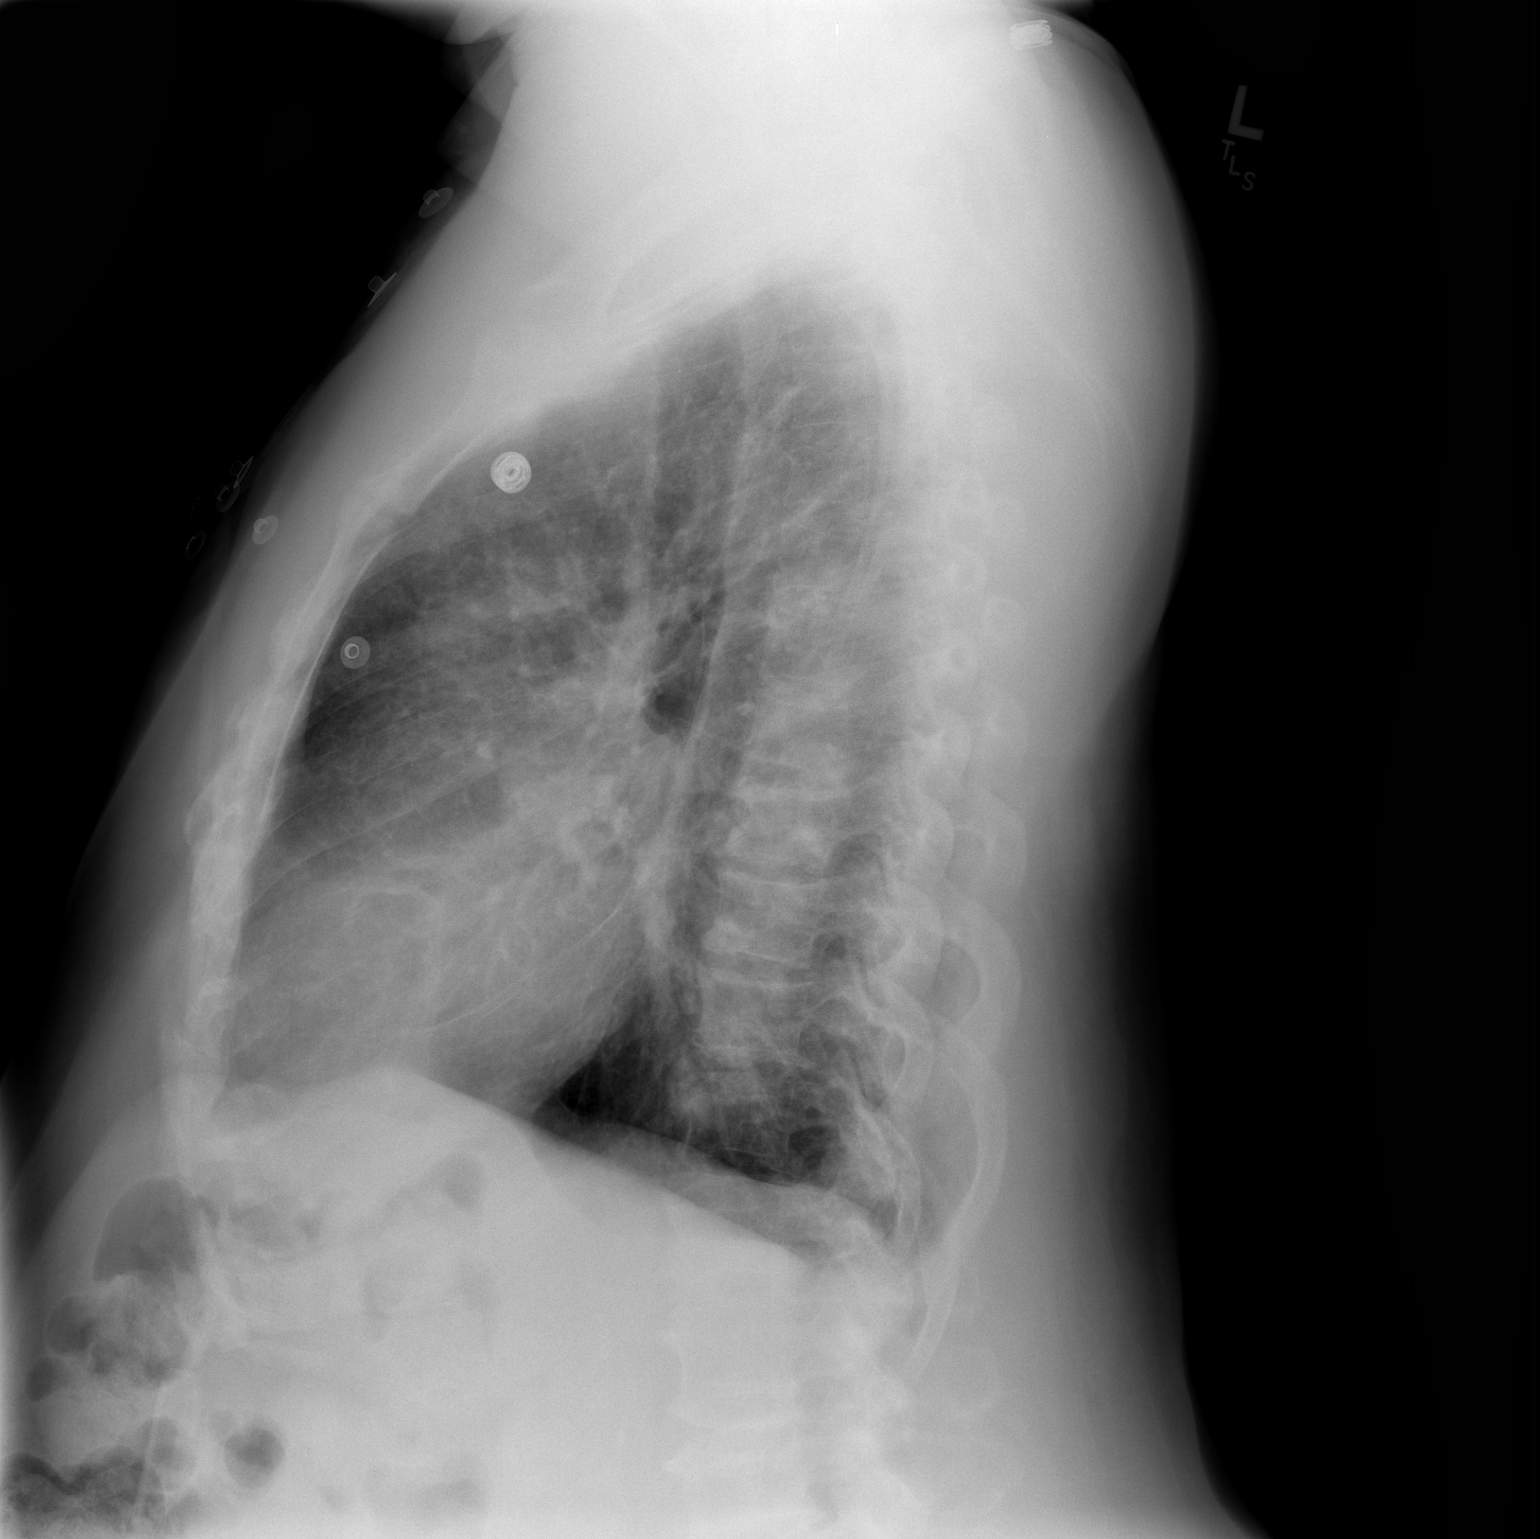

[2 of 2 positions shown; findings below may reference images not displayed]

FINDINGS: The heart is mildly enlarged.  There is no congestive
heart failure.  There are changes of COPD with generally prominent
markings.  No active airspace disease or pleural fluid.
IMPRESSION: Mild cardiomegaly and chronic lung changes - no acute findings.

## 2011-12-10 ENCOUNTER — Inpatient Hospital Stay (HOSPITAL_COMMUNITY)
Admission: EM | Admit: 2011-12-10 | Discharge: 2011-12-13 | DRG: 065 | Disposition: A | Discharge status: Skilled Nursing Facility | Payer: Medicaid Other | Attending: Internal Medicine | Admitting: Internal Medicine

## 2011-12-10 ENCOUNTER — Emergency Department (HOSPITAL_COMMUNITY): Payer: Medicaid Other

## 2011-12-10 ENCOUNTER — Encounter (HOSPITAL_COMMUNITY): Payer: Self-pay | Admitting: *Deleted

## 2011-12-10 ENCOUNTER — Inpatient Hospital Stay (HOSPITAL_COMMUNITY): Payer: Medicaid Other

## 2011-12-10 DIAGNOSIS — G8190 Hemiplegia, unspecified affecting unspecified side: Secondary | ICD-10-CM | POA: Diagnosis present

## 2011-12-10 DIAGNOSIS — N183 Chronic kidney disease, stage 3 unspecified: Secondary | ICD-10-CM

## 2011-12-10 DIAGNOSIS — N289 Disorder of kidney and ureter, unspecified: Secondary | ICD-10-CM

## 2011-12-10 DIAGNOSIS — I498 Other specified cardiac arrhythmias: Secondary | ICD-10-CM | POA: Diagnosis present

## 2011-12-10 DIAGNOSIS — N401 Enlarged prostate with lower urinary tract symptoms: Secondary | ICD-10-CM | POA: Diagnosis present

## 2011-12-10 DIAGNOSIS — Z7982 Long term (current) use of aspirin: Secondary | ICD-10-CM

## 2011-12-10 DIAGNOSIS — M545 Low back pain, unspecified: Secondary | ICD-10-CM

## 2011-12-10 DIAGNOSIS — E876 Hypokalemia: Secondary | ICD-10-CM | POA: Diagnosis not present

## 2011-12-10 DIAGNOSIS — I69998 Other sequelae following unspecified cerebrovascular disease: Secondary | ICD-10-CM

## 2011-12-10 DIAGNOSIS — I10 Essential (primary) hypertension: Secondary | ICD-10-CM

## 2011-12-10 DIAGNOSIS — I129 Hypertensive chronic kidney disease with stage 1 through stage 4 chronic kidney disease, or unspecified chronic kidney disease: Secondary | ICD-10-CM | POA: Diagnosis present

## 2011-12-10 DIAGNOSIS — E785 Hyperlipidemia, unspecified: Secondary | ICD-10-CM

## 2011-12-10 DIAGNOSIS — G8194 Hemiplegia, unspecified affecting left nondominant side: Secondary | ICD-10-CM

## 2011-12-10 DIAGNOSIS — I6350 Cerebral infarction due to unspecified occlusion or stenosis of unspecified cerebral artery: Principal | ICD-10-CM | POA: Diagnosis present

## 2011-12-10 DIAGNOSIS — I639 Cerebral infarction, unspecified: Secondary | ICD-10-CM

## 2011-12-10 DIAGNOSIS — Z79899 Other long term (current) drug therapy: Secondary | ICD-10-CM

## 2011-12-10 DIAGNOSIS — M5380 Other specified dorsopathies, site unspecified: Secondary | ICD-10-CM

## 2011-12-10 DIAGNOSIS — F17200 Nicotine dependence, unspecified, uncomplicated: Secondary | ICD-10-CM | POA: Diagnosis present

## 2011-12-10 DIAGNOSIS — Z792 Long term (current) use of antibiotics: Secondary | ICD-10-CM

## 2011-12-10 DIAGNOSIS — R351 Nocturia: Secondary | ICD-10-CM | POA: Diagnosis present

## 2011-12-10 DIAGNOSIS — M109 Gout, unspecified: Secondary | ICD-10-CM

## 2011-12-10 DIAGNOSIS — N138 Other obstructive and reflux uropathy: Secondary | ICD-10-CM | POA: Diagnosis present

## 2011-12-10 DIAGNOSIS — R29898 Other symptoms and signs involving the musculoskeletal system: Secondary | ICD-10-CM | POA: Diagnosis present

## 2011-12-10 LAB — CBC WITH DIFFERENTIAL/PLATELET
Eosinophils Absolute: 0.1 10*3/uL (ref 0.0–0.7)
Eosinophils Relative: 1 % (ref 0–5)
HCT: 47.6 % (ref 39.0–52.0)
Hemoglobin: 15.9 g/dL (ref 13.0–17.0)
Lymphs Abs: 1.6 10*3/uL (ref 0.7–4.0)
MCH: 31.1 pg (ref 26.0–34.0)
MCV: 93.2 fL (ref 78.0–100.0)
Monocytes Absolute: 0.7 10*3/uL (ref 0.1–1.0)
Monocytes Relative: 7 % (ref 3–12)
RBC: 5.11 MIL/uL (ref 4.22–5.81)

## 2011-12-10 LAB — COMPREHENSIVE METABOLIC PANEL
ALT: 17 U/L (ref 0–53)
AST: 19 U/L (ref 0–37)
CO2: 27 mEq/L (ref 19–32)
Calcium: 10.1 mg/dL (ref 8.4–10.5)
GFR calc non Af Amer: 48 mL/min — ABNORMAL LOW (ref >90)
Sodium: 139 mEq/L (ref 135–145)

## 2011-12-10 MED ORDER — LABETALOL HCL 200 MG PO TABS
200.0000 mg | ORAL_TABLET | Freq: Three times a day (TID) | ORAL | Status: DC
  Administered 2011-12-10 – 2011-12-13 (×8): 200 mg via ORAL
  Filled 2011-12-10 (×8): qty 1

## 2011-12-10 MED ORDER — ACETAMINOPHEN 650 MG RE SUPP: 650.0000 mg | RECTAL | Status: DC | PRN

## 2011-12-10 MED ORDER — ONDANSETRON HCL 4 MG/2ML IJ SOLN: 4.0000 mg | Freq: Four times a day (QID) | INTRAMUSCULAR | Status: DC | PRN

## 2011-12-10 MED ORDER — SODIUM CHLORIDE 0.9 % IV SOLN
INTRAVENOUS | Status: DC
  Administered 2011-12-10 – 2011-12-11 (×2): via INTRAVENOUS

## 2011-12-10 MED ORDER — TAMSULOSIN HCL 0.4 MG PO CAPS
0.4000 mg | ORAL_CAPSULE | Freq: Every day | ORAL | Status: DC
  Administered 2011-12-11 – 2011-12-12 (×2): 0.4 mg via ORAL
  Filled 2011-12-10 (×2): qty 1

## 2011-12-10 MED ORDER — NITROGLYCERIN 0.4 MG SL SUBL: 0.4000 mg | SUBLINGUAL_TABLET | SUBLINGUAL | Status: DC | PRN

## 2011-12-10 MED ORDER — DULOXETINE HCL 60 MG PO CPEP
60.0000 mg | ORAL_CAPSULE | Freq: Every day | ORAL | Status: DC
  Administered 2011-12-11 – 2011-12-13 (×3): 60 mg via ORAL
  Filled 2011-12-10 (×3): qty 1

## 2011-12-10 MED ORDER — ONDANSETRON HCL 4 MG/2ML IJ SOLN: 4.0000 mg | Freq: Three times a day (TID) | INTRAMUSCULAR | Status: DC | PRN

## 2011-12-10 MED ORDER — AMLODIPINE BESYLATE 5 MG PO TABS
10.0000 mg | ORAL_TABLET | Freq: Every day | ORAL | Status: DC
  Administered 2011-12-11 – 2011-12-13 (×3): 10 mg via ORAL
  Filled 2011-12-10 (×3): qty 2

## 2011-12-10 MED ORDER — SENNOSIDES-DOCUSATE SODIUM 8.6-50 MG PO TABS: 1.0000 | ORAL_TABLET | Freq: Every evening | ORAL | Status: DC | PRN

## 2011-12-10 MED ORDER — ACETAMINOPHEN 325 MG PO TABS: 650.0000 mg | ORAL_TABLET | ORAL | Status: DC | PRN

## 2011-12-10 MED ORDER — CLOPIDOGREL BISULFATE 75 MG PO TABS
75.0000 mg | ORAL_TABLET | Freq: Every day | ORAL | Status: DC
  Administered 2011-12-11 – 2011-12-13 (×3): 75 mg via ORAL
  Filled 2011-12-10 (×3): qty 1

## 2011-12-10 MED ORDER — TIZANIDINE HCL 4 MG PO TABS
4.0000 mg | ORAL_TABLET | Freq: Four times a day (QID) | ORAL | Status: DC | PRN
  Filled 2011-12-10: qty 1

## 2011-12-10 MED ORDER — ATORVASTATIN CALCIUM 20 MG PO TABS: 20.0000 mg | ORAL_TABLET | Freq: Every day | ORAL | Status: DC

## 2011-12-10 MED ORDER — ENOXAPARIN SODIUM 40 MG/0.4ML ~~LOC~~ SOLN
40.0000 mg | SUBCUTANEOUS | Status: DC
  Administered 2011-12-11 – 2011-12-12 (×2): 40 mg via SUBCUTANEOUS
  Filled 2011-12-10 (×2): qty 0.4

## 2011-12-10 NOTE — ED Provider Notes (Signed)
History   This chart was scribed for Brent Booze, MD by Toya Smothers, ED Scribe. The patient was seen in room APA14/APA14. Patient's care was started at 0851.   CSN: [id number]  Arrival date & time 12/10/11  4098   First MD Initiated Contact with Patient 12/10/11 [phone]      Chief Complaint  Patient presents with  . Weakness    HPI   Brent Acosta is a 61 y.o. male brought by relative to the Emergency Department complaining of 1 day of recurrent, progressive, constant, moderate weakness. Pt denies pain. He was seen for similar symptoms yesterday at [facility] yesterday and Dx with UTI. Pt reports no improvement, and per relative, experience difficulty gaiting today. Bowels and bladder are normal. Symptoms have not been treated PTA. No fever, chills, diaphoresis, cough, congestion, rhinorrhea, chest pain, SOB, or n/v/d. Pt admits heavy tobacco use, and denies consumption of alcohol and use of illicit drugs.   Past Medical History  Diagnosis Date  . 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 HTN (hypertension)   . Gout     big toe    History reviewed. No pertinent past surgical history.  History reviewed. No pertinent family history.  History  Substance Use Topics  . Smoking status: Heavy Tobacco Smoker -- 0.5 packs/day    Types: Cigarettes  . Smokeless tobacco: Not on file  . Alcohol Use: No    Review of Systems  Musculoskeletal: Positive for gait problem. Negative for back pain.  Neurological: Positive for weakness.  All other systems reviewed and are negative.    Allergies  Review of patient's allergies indicates no known allergies.  Home Medications  No current outpatient prescriptions on file.  BP 154/92  Pulse 52  Temp 97.9 F (36.6 C) (Oral)  Resp 17  SpO2 97%  Physical Exam  Nursing note and vitals reviewed. Constitutional: He appears well-developed and well-nourished.       Lethargic but arousable.  HENT:  Head: Normocephalic and atraumatic.    Eyes:       Pupils 2 mm and non-reactive. Unable to see fundi due to constricted pupils.  Neck: Neck supple. No tracheal deviation present. No thyromegaly present.  Cardiovascular: Normal rate and regular rhythm.   No murmur heard. Pulmonary/Chest: Effort normal and breath sounds normal.  Abdominal: Soft. Bowel sounds are normal. He exhibits no distension. There is no tenderness.  Musculoskeletal: Normal range of motion. He exhibits no edema and no tenderness.  Neurological: He is alert.       Mild left central facial droop. Mild weakness of left arm and left leg.   Skin: Skin is warm and dry. No rash noted.  Psychiatric: He has a normal mood and affect.    ED Course  Procedures DIAGNOSTIC STUDIES: Oxygen Saturation is 97% on room air, normal by my interpretation.    COORDINATION OF CARE: 08:58- Evaluated Pt. Pt is lethargic, but arousable, and without distress.    Results for orders placed during the hospital encounter of 12/10/11  CBC WITH DIFFERENTIAL      Component Value Range   WBC 10.9 (*) 4.0 - 10.5 K/uL   RBC 5.11  4.22 - 5.81 MIL/uL   Hemoglobin 15.9  13.0 - 17.0 g/dL   HCT 47.8  29.5 - 62.1 %   MCV 93.2  78.0 - 100.0 fL   MCH 31.1  26.0 - 34.0 pg   MCHC 33.4  30.0 - 36.0 g/dL  RDW 13.3  11.5 - 15.5 %   Platelets 223  150 - 400 K/uL   Neutrophils Relative 78 (*) 43 - 77 %   Neutro Abs 8.5 (*) 1.7 - 7.7 K/uL   Lymphocytes Relative 14  12 - 46 %   Lymphs Abs 1.6  0.7 - 4.0 K/uL   Monocytes Relative 7  3 - 12 %   Monocytes Absolute 0.7  0.1 - 1.0 K/uL   Eosinophils Relative 1  0 - 5 %   Eosinophils Absolute 0.1  0.0 - 0.7 K/uL   Basophils Relative 0  0 - 1 %   Basophils Absolute 0.0  0.0 - 0.1 K/uL  COMPREHENSIVE METABOLIC PANEL      Component Value Range   Sodium 139  135 - 145 mEq/L   Potassium 3.5  3.5 - 5.1 mEq/L   Chloride 98  96 - 112 mEq/L   CO2 27  19 - 32 mEq/L   Glucose, Bld 108 (*) 70 - 99 mg/dL   BUN 25 (*) 6 - 23 mg/dL   Creatinine, Ser 2.13  (*) 0.50 - 1.35 mg/dL   Calcium 08.6  8.4 - 57.8 mg/dL   Total Protein 7.7  6.0 - 8.3 g/dL   Albumin 3.9  3.5 - 5.2 g/dL   AST 19  0 - 37 U/L   ALT 17  0 - 53 U/L   Alkaline Phosphatase 46  39 - 117 U/L   Total Bilirubin 0.6  0.3 - 1.2 mg/dL   GFR calc non Af Amer 48 (*) >90 mL/min   GFR calc Af Amer 56 (*) >90 mL/min  TROPONIN I      Component Value Range   Troponin I <0.30  <0.30 ng/mL  CK      Component Value Range   Total CK 187  7 - 232 U/L   Ct Head Wo Contrast  12/10/2011  *RADIOLOGY REPORT*  Clinical Data: Weakness, altered level of consciousness  CT HEAD WITHOUT CONTRAST  Technique:  Contiguous axial images were obtained from the base of the skull through the vertex without contrast.  Comparison: CT 10/02/2008.  Findings: There is new hypodense lesions within the right lentiform nucleus and head of the caudate nucleus consistent with acute or subacute infarction.  Again demonstrated subcortical hypodensity in the left parietal lobe.  No intracranial hemorrhage.  No hydrocephalus.  Basilar cisterns are patent.  Paranasal sinuses and mastoid air cells are clear.  Orbits are normal.  IMPRESSION:  1.  Acute versus subacute infarctions within the right basal ganglia (lentiform nucleus and caudate). 2.  Remote subcortical infarction in the left parietal lobe.   Original Report Authenticated By: Genevive Bi, M.D.    Dg Chest Portable 1 View  12/10/2011  *RADIOLOGY REPORT*  Clinical Data: Weakness, shortness of breath.  PORTABLE CHEST - 1 VIEW  Comparison: 04/11/2009  Findings: Heart is borderline in size.  No confluent airspace opacities.  No effusions.  No acute bony abnormality.  IMPRESSION: Borderline heart size. No active cardiopulmonary disease.   Original Report Authenticated By: Charlett Nose, M.D.     Images viewed by me.   Date: 12/10/2011  Rate: 67  Rhythm: normal sinus rhythm  QRS Axis: right  Intervals: normal  ST/T Wave abnormalities: ST depression and T wave  inversion in the anterolateral leads, nonspecific T wave flattening in the inferior and lateral leads,  Conduction Disutrbances:none  Narrative Interpretation: Right ventricular hypertrophy, incomplete right bundle-branch block, nonspecific ST and T changes,  right axis deviation. When compared with ECG of 04/11/2009, right axis deviation has replaced left anterior fascicular block with left axis deviation, incomplete right bundle-branch block is now present, there's been no significant changes in ST and T wave changes.  Old EKG Reviewed: changes noted    1. Stroke   2. Renal insufficiency       MDM  Recently diagnosed urinary tract infection. Left-sided weakness in the patient states that it's his right side that is weak. I'm not sure if this represents poor history or a possible new stroke. Workup has been initiated. He has no significant past medical records in our system.  Family has arrived and they confirm that he normally has weakness in the right side from a stroke and normally walks with a walker. He was last known to be neurologically normal 3 days ago. Since then, he has demonstrated left-sided weakness. They state that when he was in the hospital yesterday, he was unable to ambulate with his walker and he was listing to his left side. This morning, he was found on the floor and was unable to get up. At this point, it seems most likely that he has a stroke causing left hemiparesthesias. Last known normal would be 3 days ago which is well outside the timeframe for thrombolytic therapy and a code stroke status.   CT does appear to show a subacute infarct in the right basal ganglia area which show correlates well with where his new deficit is. Case is discussed with [facility] who agrees to admit the patient.   I personally performed the services described in this documentation, which was scribed in my presence. The recorded information has been reviewed and is  accurate.      Brent Booze, MD 12/10/11 [phone]

## 2011-12-10 NOTE — H&P (Signed)
[facility] History and Physical  Brent Acosta JYN:[id number] DOB: 1950/12/01 DOA: 12/10/2011  Referring physician: Dr. Preston Fleeting PCP: Reynolds Bowl, MD  Specialists:                                       Chief Complaint: Left sided weakness  HPI: Brent Acosta is a 61 y.o. male with a history of hypertension, gout, hyperlipidemia. Patient was in her usual state of health when approximately 2 days ago he noticed onset of left-sided weakness. He does have chronic right-sided weakness or prior stroke. He denies any changes in his vision, difficulty with swallowing. His family does report he had noticed some difficulty with his speech. He was having trouble ambulating. He sought medical treatment at [facility]. He was told that he had a urinary tract infection, and was discharged home on an antibiotic. The patient's symptoms continued to get worse/did not improve. Today he was found at home, after having a fall. He was brought to the ER for evaluation, he was found to have an acute stroke. Patient has been admitted to the hospital for further treatment.  Patient also reports occasional difficulty initiating urine. He admits to frequent nocturia. He has been told he has enlarged prostate.  Review of Systems: Pertinent positives as per HPI, otherwise negative  Past Medical History  Diagnosis Date  . 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 HTN (hypertension)   . Gout     big toe   History reviewed. No pertinent past surgical history. Social History:  reports that he has been smoking Cigarettes.  He has been smoking about .5 packs per day. He does not have any smokeless tobacco history on file. He reports that he does not drink alcohol or use illicit drugs. Lives alone  No Known Allergies  Family history: repots that stroke, heart disease, hypertension and diabetes runs in the family  Prior to Admission medications   Medication Sig Start Date End Date Taking? Authorizing Provider    amLODipine (NORVASC) 5 MG tablet Take 10 mg by mouth daily.   Yes Historical Provider, MD  aspirin 81 MG chewable tablet Chew 81 mg by mouth daily.   Yes Historical Provider, MD  atorvastatin (LIPITOR) 20 MG tablet Take 20 mg by mouth daily.   Yes Historical Provider, MD  colchicine 0.6 MG tablet Take 0.6 mg by mouth daily as needed.   Yes Historical Provider, MD  DULoxetine (CYMBALTA) 60 MG capsule Take 60 mg by mouth daily.   Yes Historical Provider, MD  hydrochlorothiazide (HYDRODIURIL) 25 MG tablet Take 25 mg by mouth daily.   Yes Historical Provider, MD  labetalol (NORMODYNE) 200 MG tablet Take 200 mg by mouth 3 (three) times daily.   Yes Historical Provider, MD  nitroGLYCERIN (NITROSTAT) 0.4 MG SL tablet Place 0.4 mg under the tongue every 5 (five) minutes as needed. Chest Pain   Yes Historical Provider, MD  quinapril (ACCUPRIL) 40 MG tablet Take 40 mg by mouth 2 (two) times daily.   Yes Historical Provider, MD  tiZANidine (ZANAFLEX) 4 MG tablet Take 4 mg by mouth every 6 (six) hours as needed. Muscle Spasms   Yes Historical Provider, MD   Physical Exam: Filed Vitals:   12/10/11 0900 12/10/11 1121 12/10/11 1130 12/10/11 1300  BP: 156/99  158/92 156/94  Pulse: 60  58 66  Temp:  97.9 F (36.6  C)  98 F (36.7 C)  TempSrc:    Oral  Resp:   14 28  Height:    5\' 6"  (1.676 m)  Weight:    87.998 kg (194 lb)  SpO2: 98%  98% 97%     General:  NAD  Eyes: pupils are equal, round and reactive to light, EOMI  ENT: no pharyngeal erythema or exudates  Neck: supple  Cardiovascular: s1, s2, rrr, no peripheral edema  Respiratory: CTA B  Abdomen: soft, nt, bs+  Skin: normal  Musculoskeletal: deferred  Psychiatric: flat affect, cooperative with exam  Neurologic: 5/5 in upper and lower extremities b/l, mild pronator drift on the left, cranial nerves appear to be grossly intact, no facial asymmetry  Labs on Admission:  Basic Metabolic Panel:  Lab 12/10/11 2956  NA 139  K 3.5   CL 98  CO2 27  GLUCOSE 108*  BUN 25*  CREATININE 1.51*  CALCIUM 10.1  MG --  PHOS --   Liver Function Tests:  Lab 12/10/11 0813  AST 19  ALT 17  ALKPHOS 46  BILITOT 0.6  PROT 7.7  ALBUMIN 3.9   No results found for this basename: LIPASE:5,AMYLASE:5 in the last 168 hours No results found for this basename: AMMONIA:5 in the last 168 hours CBC:  Lab 12/10/11 0813  WBC 10.9*  NEUTROABS 8.5*  HGB 15.9  HCT 47.6  MCV 93.2  PLT 223   Cardiac Enzymes:  Lab 12/10/11 0813  CKTOTAL 187  CKMB --  CKMBINDEX --  TROPONINI <0.30    BNP (last 3 results) No results found for this basename: PROBNP:3 in the last 8760 hours CBG: No results found for this basename: GLUCAP:5 in the last 168 hours  Radiological Exams on Admission: Ct Head Wo Contrast  12/10/2011  *RADIOLOGY REPORT*  Clinical Data: Weakness, altered level of consciousness  CT HEAD WITHOUT CONTRAST  Technique:  Contiguous axial images were obtained from the base of the skull through the vertex without contrast.  Comparison: CT 10/02/2008.  Findings: There is new hypodense lesions within the right lentiform nucleus and head of the caudate nucleus consistent with acute or subacute infarction.  Again demonstrated subcortical hypodensity in the left parietal lobe.  No intracranial hemorrhage.  No hydrocephalus.  Basilar cisterns are patent.  Paranasal sinuses and mastoid air cells are clear.  Orbits are normal.  IMPRESSION:  1.  Acute versus subacute infarctions within the right basal ganglia (lentiform nucleus and caudate). 2.  Remote subcortical infarction in the left parietal lobe.   Original Report Authenticated By: Genevive Bi, M.D.    Mr [facility] Wo Contrast  12/10/2011  *RADIOLOGY REPORT*  Clinical Data: Stroke  MRA HEAD WITHOUT CONTRAST  Technique: Angiographic images of the Circle of Willis were obtained using MRA technique without intravenous contrast.  Comparison: CT head 12/10/2011  Findings: Both vertebral  arteries are patent to the basilar.  Left vertebral artery is dominant.  Right PICA is patent.  Left PICA not visualized.  Basilar is widely patent.  Proximal posterior cerebral arteries are patent.  There is diffuse disease in the left posterior cerebral artery which is moderately severe.  Mild disease in the right posterior cerebral artery.  Internal carotid artery is patent bilaterally.  There is focal signal loss in the anterior genu of the cavernous carotid bilaterally, felt to be artifact.  Anterior and middle cerebral arteries are patent.  There is a severe stenosis at the right M1 bifurcation with irregularity in the right middle  cerebral artery branches bilaterally.  This corresponds to an area of acute infarct.  See separate MRI brain report.  Mild to moderate atherosclerotic disease in left middle cerebral artery branches. Mild disease and anterior cerebral arteries.  Negative for cerebral aneurysm.  IMPRESSION: Significant intracranial atherosclerotic disease with moderate disease in the left posterior cerebral artery and mild disease in the right posterior cerebral artery.  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   Original Report Authenticated By: Janeece Riggers, M.D.    Mr Brain Wo Contrast  12/10/2011  *RADIOLOGY REPORT*  Clinical Data: Stroke.  Left-sided weakness  MRI HEAD WITHOUT CONTRAST  Technique:  Multiplanar, multiecho pulse sequences of the brain and surrounding structures were obtained according to standard protocol without intravenous contrast.  Comparison: CT head 12/10/2011  Findings: Acute right MCA infarct.  There is acute infarct involving the right putamen and caudate.  There is also a small area of acute infarct in the  right     frontal temporal lobe.  No other areas of acute infarct.  Chronic left parietal infarct.  Chronic microvascular ischemia in the white matter.  Mild chronic ischemia in the pons.  Small  chronic infarct left thalamus.  Mild chronic micro hemorrhage right parietal white matter.  No mass lesion or midline shift is present.  Mild chronic sinusitis.  IMPRESSION: Acute non hemorrhagic infarct right MCA territory involving the basal ganglia and right frontal temporal lobe.  Moderate chronic ischemic changes.   Original Report Authenticated By: Janeece Riggers, M.D.    Dg Chest Portable 1 View  12/10/2011  *RADIOLOGY REPORT*  Clinical Data: Weakness, shortness of breath.  PORTABLE CHEST - 1 VIEW  Comparison: 04/11/2009  Findings: Heart is borderline in size.  No confluent airspace opacities.  No effusions.  No acute bony abnormality.  IMPRESSION: Borderline heart size. No active cardiopulmonary disease.   Original Report Authenticated By: Charlett Nose, M.D.     EKG: Independently reviewed. There does not appear to be acute ST-T changes  Assessment/Plan Principal Problem:  *Stroke Active Problems:  CKD (chronic kidney disease) stage 3, GFR 30-59 ml/min  Left hemiparesis  Hypertension  Gout  Hyperlipidemia   1. Acute stroke. Patient is out of the window for TPA due to delay in patient arrival. The office and was approximately 2 days ago. MRI has confirmed acute stroke. He has significant cerebrovascular disease, mostly to be treated medically. Patient reports taking aspirin on a daily basis. Since he's had an acute stroke while taking aspirin, we will have to change his antiplatelet therapy to Plavix. He'll undergo remainder of stroke workup including carotid Dopplers and 2-D echocardiogram. Will check lipid panel and hemoglobin A1c. We'll continue his hip antihypertensives. Will allow his blood pressures to run a little bit higher for the next 24 hours to ensure cerebral perfusion. 2. Gallops. Appears to be controlled at this time. 3. Chronic kidney disease stage III. His creatinine one year ago was also 1.5. We will give some IV fluids and recheck creatinine in the morning. Hold ACE  inhibitor and hydrochlorothiazide for now 4. Hyperlipidemia. Continue statin. 5. BPH. We'll start the patient on Flomax. 6. Disposition. Patient will be seen by physical therapy to determine appropriate discharge disposition. His family reports that he does live alone.   Code Status: full code Family Communication: discussed with patient and family at bedside Disposition Plan: discharge home once medically improved  Time spent:  MEMON,JEHANZEB [facility] Pager  [phone]  If 7PM-7AM, please contact night-coverage [email or web address] Password [email or web address] 12/10/2011, 4:10 PM

## 2011-12-10 NOTE — ED Notes (Signed)
Swallow screen completed but unable to open the appropriate screen on this computer. Pt offered water with straw and drank almost a full cup w/o difficulty. Then ate cracker also w/o difficulty. No change in oxygen sat.

## 2011-12-10 NOTE — ED Notes (Signed)
Pt seen at [facility] yesterday, DX with UTI. Today pt was unable to stand, weakness, denies pain, N/V, Pt is A&O x3, has facial drooping lt from previou8s CVA

## 2011-12-11 ENCOUNTER — Inpatient Hospital Stay (HOSPITAL_COMMUNITY): Payer: Medicaid Other

## 2011-12-11 LAB — URINALYSIS, ROUTINE W REFLEX MICROSCOPIC
Glucose, UA: NEGATIVE mg/dL
Ketones, ur: NEGATIVE mg/dL
Leukocytes, UA: NEGATIVE
Protein, ur: NEGATIVE mg/dL
Urobilinogen, UA: 0.2 mg/dL (ref 0.0–1.0)

## 2011-12-11 LAB — RAPID URINE DRUG SCREEN, HOSP PERFORMED
Amphetamines: POSITIVE — AB
Opiates: NOT DETECTED
Tetrahydrocannabinol: NOT DETECTED

## 2011-12-11 LAB — URINE MICROSCOPIC-ADD ON

## 2011-12-11 LAB — LIPID PANEL
Cholesterol: 208 mg/dL — ABNORMAL HIGH (ref 0–200)
Total CHOL/HDL Ratio: 3.9 RATIO

## 2011-12-11 LAB — BASIC METABOLIC PANEL
CO2: 27 mEq/L (ref 19–32)
Chloride: 103 mEq/L (ref 96–112)
GFR calc Af Amer: 62 mL/min — ABNORMAL LOW (ref >90)
Potassium: 3 mEq/L — ABNORMAL LOW (ref 3.5–5.1)

## 2011-12-11 LAB — HEMOGLOBIN A1C
Hgb A1c MFr Bld: 5.6 % (ref <5.7)
Mean Plasma Glucose: 114 mg/dL (ref <117)

## 2011-12-11 MED ORDER — ATORVASTATIN CALCIUM 40 MG PO TABS
40.0000 mg | ORAL_TABLET | Freq: Every day | ORAL | Status: DC
  Administered 2011-12-11 – 2011-12-13 (×3): 40 mg via ORAL
  Filled 2011-12-11 (×3): qty 1

## 2011-12-11 MED ORDER — POTASSIUM CHLORIDE CRYS ER 20 MEQ PO TBCR
40.0000 meq | EXTENDED_RELEASE_TABLET | ORAL | Status: AC
  Administered 2011-12-11 (×2): 40 meq via ORAL
  Filled 2011-12-11: qty 2

## 2011-12-11 NOTE — Progress Notes (Signed)
[facility]             Progress Note   Subjective: No new complaints  Objective: Vital signs in last 24 hours: Temp:  [97.6 F (36.4 C)-98.6 F (37 C)] 97.6 F (36.4 C) (11/30 1304) Pulse Rate:  [64-70] 70  (11/30 1304) Resp:  [16] 16  (11/30 1304) BP: (150-165)/(75-85) 165/80 mmHg (11/30 1304) SpO2:  [97 %-99 %] 97 % (11/30 1304) Weight change:  Last BM Date: 12/09/11  Intake/Output from previous day: 11/29 0701 - 11/30 0700 In: 874.7 [P.O.:240; I.V.:634.7] Out: 375 [Urine:375] Total I/O In: -  Out: 350 [Urine:350]   Physical Exam: General: Alert, awake, oriented x3, in no acute distress. HEENT: No bruits, no goiter. Heart: Regular rate and rhythm, without murmurs, rubs, gallops. Lungs: Clear to auscultation bilaterally. Abdomen: Soft, nontender, nondistended, positive bowel sounds. Extremities: No clubbing cyanosis or edema with positive pedal pulses.     Lab Results: Basic Metabolic Panel:  Basename 12/11/11 0637 12/10/11 0813  NA 140 139  K 3.0* 3.5  CL 103 98  CO2 27 27  GLUCOSE 102* 108*  BUN 24* 25*  CREATININE 1.39* 1.51*  CALCIUM 8.9 10.1  MG -- --  PHOS -- --   Liver Function Tests:  [facility] 12/10/11 0813  AST 19  ALT 17  ALKPHOS 46  BILITOT 0.6  PROT 7.7  ALBUMIN 3.9   No results found for this basename: LIPASE:2,AMYLASE:2 in the last 72 hours No results found for this basename: AMMONIA:2 in the last 72 hours CBC:  Basename 12/10/11 0813  WBC 10.9*  NEUTROABS 8.5*  HGB 15.9  HCT 47.6  MCV 93.2  PLT 223   Cardiac Enzymes:  Basename 12/10/11 0813  CKTOTAL 187  CKMB --  CKMBINDEX --  TROPONINI <0.30   BNP: No results found for this basename: PROBNP:3 in the last 72 hours D-Dimer: No results found for this basename: DDIMER:2 in the last 72 hours CBG: No results found for this basename: GLUCAP:6 in the last 72 hours Hemoglobin A1C: No results found for this basename: HGBA1C in the last 72 hours Fasting  Lipid Panel:  Basename 12/11/11 0637  CHOL 208*  HDL 53  LDLCALC 135*  TRIG 102  CHOLHDL 3.9  LDLDIRECT --   Thyroid Function Tests: No results found for this basename: TSH,T4TOTAL,FREET4,T3FREE,THYROIDAB in the last 72 hours Anemia Panel: No results found for this basename: VITAMINB12,FOLATE,FERRITIN,TIBC,IRON,RETICCTPCT in the last 72 hours Coagulation: No results found for this basename: LABPROT:2,INR:2 in the last 72 hours Urine Drug Screen: Drugs of Abuse     Component Value Date/Time   LABOPIA NONE DETECTED 12/11/2011 0632   COCAINSCRNUR NONE DETECTED 12/11/2011 0632   LABBENZ NONE DETECTED 12/11/2011 0632   AMPHETMU POSITIVE* 12/11/2011 0632   THCU NONE DETECTED 12/11/2011 0632   LABBARB NONE DETECTED 12/11/2011 1610    Alcohol Level: No results found for this basename: ETH:2 in the last 72 hours Urinalysis:  Basename 12/11/11 0631  COLORURINE YELLOW  LABSPEC >1.030*  PHURINE 5.5  GLUCOSEU NEGATIVE  HGBUR SMALL*  BILIRUBINUR NEGATIVE  KETONESUR NEGATIVE  PROTEINUR NEGATIVE  UROBILINOGEN 0.2  NITRITE NEGATIVE  LEUKOCYTESUR NEGATIVE   No results found for this or any previous visit (from the past 240 hour(s)).  Studies/Results: Ct Head Wo Contrast  12/10/2011  *RADIOLOGY REPORT*  Clinical Data: Weakness, altered level of consciousness  CT HEAD WITHOUT CONTRAST  Technique:  Contiguous axial images were obtained from the base of the skull through the vertex without contrast.  Comparison: CT 10/02/2008.  Findings: There is new hypodense lesions within the right lentiform nucleus and head of the caudate nucleus consistent with acute or subacute infarction.  Again demonstrated subcortical hypodensity in the left parietal lobe.  No intracranial hemorrhage.  No hydrocephalus.  Basilar cisterns are patent.  Paranasal sinuses and mastoid air cells are clear.  Orbits are normal.  IMPRESSION:  1.  Acute versus subacute infarctions within the right basal ganglia (lentiform  nucleus and caudate). 2.  Remote subcortical infarction in the left parietal lobe.   Original Report Authenticated By: Genevive Bi, M.D.    Mr [facility] Wo Contrast  12/10/2011  *RADIOLOGY REPORT*  Clinical Data: Stroke  MRA HEAD WITHOUT CONTRAST  Technique: Angiographic images of the Circle of Willis were obtained using MRA technique without intravenous contrast.  Comparison: CT head 12/10/2011  Findings: Both vertebral arteries are patent to the basilar.  Left vertebral artery is dominant.  Right PICA is patent.  Left PICA not visualized.  Basilar is widely patent.  Proximal posterior cerebral arteries are patent.  There is diffuse disease in the left posterior cerebral artery which is moderately severe.  Mild disease in the right posterior cerebral artery.  Internal carotid artery is patent bilaterally.  There is focal signal loss in the anterior genu of the cavernous carotid bilaterally, felt to be artifact.  Anterior and middle cerebral arteries are patent.  There is a severe stenosis at the right M1 bifurcation with irregularity in the right middle cerebral artery branches bilaterally.  This corresponds to an area of acute infarct.  See separate MRI brain report.  Mild to moderate atherosclerotic disease in left middle cerebral artery branches. Mild disease and anterior cerebral arteries.  Negative for cerebral aneurysm.  IMPRESSION: Significant intracranial atherosclerotic disease with moderate disease in the left posterior cerebral artery and mild disease in the right posterior cerebral artery.  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   Original Report Authenticated By: Janeece Riggers, M.D.    Mr Brain Wo Contrast  12/10/2011  *RADIOLOGY REPORT*  Clinical Data: Stroke.  Left-sided weakness  MRI HEAD WITHOUT CONTRAST  Technique:  Multiplanar, multiecho pulse sequences of the brain and surrounding structures were obtained according  to standard protocol without intravenous contrast.  Comparison: CT head 12/10/2011  Findings: Acute right MCA infarct.  There is acute infarct involving the right putamen and caudate.  There is also a small area of acute infarct in the  right     frontal temporal lobe.  No other areas of acute infarct.  Chronic left parietal infarct.  Chronic microvascular ischemia in the white matter.  Mild chronic ischemia in the pons.  Small chronic infarct left thalamus.  Mild chronic micro hemorrhage right parietal white matter.  No mass lesion or midline shift is present.  Mild chronic sinusitis.  IMPRESSION: Acute non hemorrhagic infarct right MCA territory involving the basal ganglia and right frontal temporal lobe.  Moderate chronic ischemic changes.   Original Report Authenticated By: Janeece Riggers, M.D.    US Carotid Duplex Bilateral  12/11/2011  *RADIOLOGY REPORT*  Clinical Data: Acute right MCA distribution stroke identified on MRI yesterday.  BILATERAL CAROTID DUPLEX ULTRASOUND  Technique: Wallace Cullens scale imaging, color Doppler and duplex ultrasound was performed of bilateral carotid and vertebral arteries in the neck.  Comparison:  None.  Criteria:  Quantification of carotid stenosis is based on velocity parameters that correlate the residual internal carotid diameter with  NASCET-based stenosis levels, using the diameter of the distal internal carotid lumen as the denominator for stenosis measurement.  The following velocity measurements were obtained:                   PEAK SYSTOLIC/END DIASTOLIC RIGHT ICA:                        54/21cm/sec CCA:                        54/15cm/sec SYSTOLIC ICA/CCA RATIO:     1.00 DIASTOLIC ICA/CCA RATIO:    1.43 ECA:                        115/14cm/sec  LEFT ICA:                        72/25cm/sec CCA:                        73/19cm/sec SYSTOLIC ICA/CCA RATIO:     0.98 DIASTOLIC ICA/CCA RATIO:    1.30 ECA:                        168/21cm/sec  Findings:  RIGHT CAROTID ARTERY: Intimal  thickening involving the CCA.  Mixed calcified and noncalcified plaque in the carotid bulb without visible stenosis of greater than 50% diameter at gray scale or color imaging.  Spectral broadening involving the ICA waveform.  RIGHT VERTEBRAL ARTERY:  Antegrade flow with normal waveform.  LEFT CAROTID ARTERY: Intimal thickening involving the CCA. Predominately noncalcified plaque in the carotid bulb without visible stenosis of greater than 50% diameter at gray scale or color imaging.  Spectral broadening involving the ICA waveform.  LEFT VERTEBRAL ARTERY:  Antegrade flow with normal waveform.  IMPRESSION:  1.  No evidence of hemodynamically significant stenosis involving either the right or left carotid circulation in the neck by Doppler criteria. 2.  Mixed calcified and noncalcified plaque in the right carotid bulb and predominately noncalcified plaque in the left carotid bulb. 3.  Antegrade flow in both vertebral arteries.   Original Report Authenticated By: Hulan Saas, M.D.    Dg Chest Portable 1 View  12/10/2011  *RADIOLOGY REPORT*  Clinical Data: Weakness, shortness of breath.  PORTABLE CHEST - 1 VIEW  Comparison: 04/11/2009  Findings: Heart is borderline in size.  No confluent airspace opacities.  No effusions.  No acute bony abnormality.  IMPRESSION: Borderline heart size. No active cardiopulmonary disease.   Original Report Authenticated By: Charlett Nose, M.D.     Medications: Scheduled Meds:   . amLODipine  10 mg Oral Daily  . atorvastatin  40 mg Oral Daily  . clopidogrel  75 mg Oral Q breakfast  . DULoxetine  60 mg Oral Daily  . enoxaparin  40 mg Subcutaneous Q24H  . labetalol  200 mg Oral TID  . Tamsulosin HCl  0.4 mg Oral QPC supper  . [DISCONTINUED] atorvastatin  20 mg Oral Daily   Continuous Infusions:   . sodium chloride 75 mL/hr at 12/11/11 1129   PRN Meds:.acetaminophen, acetaminophen, nitroGLYCERIN, ondansetron (ZOFRAN) IV, senna-docusate, tiZANidine, [DISCONTINUED]  ondansetron (ZOFRAN) IV  Assessment/Plan:  Principal Problem:  *Stroke Active Problems:  CKD (chronic kidney disease) stage 3, GFR 30-59 ml/min  Left hemiparesis  Hypertension  Gout  Hyperlipidemia  Plan:  1. Acute CVA. MRI  brain confirms acute right MCA infarct.  Carotid dopplers have been unremarkable.  MRA of the brain does indicate significant intracranial atherosclerotic disease.  This will be treated with aggressive medical management.  2D echo is currently pending. We have changed his aspirin to plavix.  He is continued on his blood pressure medications. Physical therapy has seen the patient and due to his neurologic deficits, have recommended SNF placement.  Will ask csw to see.  2. Hyperlipidemia.  LDL not at goal, will increase lipitor  3. Hypokalemia, replace.  4. ckd 3, creatinine appears to be at baseline.  5. HTN. Continue outpatient regimen.  6. Dispo.  Needs to complete stroke work up, SNF placement is underway  Time spent coordinating care:   LOS: 1 day   Sua Spadafora [facility] Pager: [phone] 12/11/2011, 3:12 PM

## 2011-12-11 NOTE — Evaluation (Signed)
Physical Therapy Evaluation Patient Details Name: Brent Acosta MRN: [id number] DOB: 05/15/1950 Today's Date: 12/11/2011 Time: 6213-0865 PT Time Calculation (min): 53 min  PT Assessment / Plan / Recommendation Clinical Impression  Pt is seen for eval and found to have significant decrease in sitting and standing balance with some L sided neglect.  He tends to maintain his gaze to the R, however is able to gaze L with a struggle.  He has increased tone in both LEs and gait is very unstable, even with a walker.  He states that he had no residual deficits from prior stroke and had been totally independent with no AD.  He lives with a friend.  I am recommending SNF at d/c.  He does not have a good discharge plan in place in order to be appropriate for CIR.  Pt is agreeable to SNF.    PT Assessment  Patient needs continued PT services    Follow Up Recommendations  SNF    Does the patient have the potential to tolerate intense rehabilitation      Barriers to Discharge Decreased caregiver support      Equipment Recommendations  None recommended by PT    Recommendations for Other Services OT consult;Speech consult   Frequency Min 6X/week    Precautions / Restrictions Precautions Precautions: Fall Restrictions Weight Bearing Restrictions: No   Pertinent Vitals/Pain       Mobility  Bed Mobility Bed Mobility: Supine to Sit;Sit to Supine Supine to Sit: 3: Mod assist;HOB flat Sit to Supine: 4: Min guard Details for Bed Mobility Assistance: pt needs cues for motor planning during supine to sit...he forgets to use his LUE to assist with transfer Transfers Transfers: Sit to Stand;Stand to Sit Sit to Stand: 4: Min guard;Without upper extremity assist;From bed Stand to Sit: 4: Min guard;To chair/3-in-1;With upper extremity assist Ambulation/Gait Ambulation/Gait Assistance: 2: Max assist Ambulation Distance (Feet): 25 Feet Assistive device: Rolling walker Ambulation/Gait Assistance  Details: pt is very unstable during gait, falls L...needs assist to place LUE on walker Gait Pattern: Wide base of support;Lateral trunk lean to left;Decreased hip/knee flexion - right;Decreased hip/knee flexion - left Gait velocity: very labored General Gait Details: pt maintains gaze to the R Stairs: No    Shoulder Instructions     Exercises Other Exercises Other Exercises: rhythmic stabilization sitting for increased weight bearing on R hip   PT Diagnosis: Abnormality of gait;Difficulty walking;Hemiplegia non-dominant side  PT Problem List: Decreased strength;Decreased activity tolerance;Decreased balance;Decreased mobility;Impaired tone;Decreased knowledge of use of DME;Decreased safety awareness PT Treatment Interventions: DME instruction;Gait training;Functional mobility training;Therapeutic activities;Neuromuscular re-education;Balance training;Patient/family education   PT Goals Acute Rehab PT Goals PT Goal Formulation: With patient Time For Goal Achievement: 12/18/11 Potential to Achieve Goals: Good Pt will go Supine/Side to Sit: with min assist;with HOB 0 degrees PT Goal: Supine/Side to Sit - Progress: Goal set today Pt will Stand: with supervision;1 - 2 min;with no upper extremity support (with eyes closed) PT Goal: Stand - Progress: Goal set today Pt will Ambulate: 16 - 50 feet;with mod assist;with rolling walker PT Goal: Ambulate - Progress: Goal set today  Visit Information  Last PT Received On: 12/11/11    Subjective Data  Subjective: I'm OK Patient Stated Goal: none stated   Prior Functioning  Home Living Lives With: Friend(s) Available Help at Discharge: Friend(s);Available PRN/intermittently Type of Home: House Home Access: Stairs to enter [employer] of Steps: 1 Entrance Stairs-Rails: None Home Layout: One level Bathroom Shower/Tub: Engineer, manufacturing systems:  Standard Home Adaptive Equipment: Walker - rolling Prior Function Level of  Independence: Independent Able to Take Stairs?: Yes Driving: Yes Vocation: Unemployed Communication Communication: No difficulties Dominant Hand: Right    Cognition  Overall Cognitive Status: Appears within functional limits for tasks assessed/performed Arousal/Alertness: Awake/alert Orientation Level: Appears intact for tasks assessed Behavior During Session: [facility] for tasks performed    Extremity/Trunk Assessment Right Upper Extremity Assessment RUE ROM/Strength/Tone: Within functional levels RUE Sensation: WFL - Light Touch;WFL - Proprioception RUE Coordination: WFL - gross motor Left Upper Extremity Assessment LUE ROM/Strength/Tone: WFL for tasks assessed LUE Sensation: WFL - Light Touch;WFL - Proprioception LUE Coordination: WFL - gross motor;Deficits LUE Coordination Deficits: finger to nose is WNL, but gross motor planning is deficient, in part due to L side denial (previous comment is in error) Right Lower Extremity Assessment RLE ROM/Strength/Tone: Deficits RLE ROM/Strength/Tone Deficits: pt does have increased tone in RLE with clonus evident RLE Sensation: WFL - Light Touch;WFL - Proprioception Left Lower Extremity Assessment LLE ROM/Strength/Tone: Deficits LLE ROM/Strength/Tone Deficits: pt does have increased tone with clonus evident LLE Sensation: WFL - Light Touch;WFL - Proprioception LLE Coordination: WFL - gross motor Trunk Assessment Trunk Assessment: Normal   Balance Balance Balance Assessed: Yes Dynamic Sitting Balance Dynamic Sitting - Balance Support: No upper extremity supported;Feet supported Dynamic Sitting - Level of Assistance: 4: Min assist (falls to L) Static Standing Balance Static Standing - Balance Support: No upper extremity supported Static Standing - Level of Assistance: 4: Min assist Single Leg Stance - Right Leg: 0  Single Leg Stance - Left Leg: 0  Tandem Stance - Right Leg: 0  Tandem Stance - Left Leg: 0  Rhomberg - Eyes Closed: 0    High Level Balance High Level Balance Activites: Backward walking;Turns;Direction changes High Level Balance Comments: needs max assist with these activities  End of Session PT - End of Session Equipment Utilized During Treatment: Gait belt Activity Tolerance: Patient tolerated treatment well Patient left: in chair;with call bell/phone within reach;with chair alarm set Nurse Communication: Mobility status  GP     Myrlene Broker L 12/11/2011, 11:53 AM

## 2011-12-12 LAB — BASIC METABOLIC PANEL
CO2: 25 mEq/L (ref 19–32)
Chloride: 107 mEq/L (ref 96–112)
Glucose, Bld: 105 mg/dL — ABNORMAL HIGH (ref 70–99)
Potassium: 3.5 mEq/L (ref 3.5–5.1)
Sodium: 141 mEq/L (ref 135–145)

## 2011-12-12 MED ORDER — TAMSULOSIN HCL 0.4 MG PO CAPS: 0.4000 mg | ORAL_CAPSULE | Freq: Every day | ORAL | Status: AC

## 2011-12-12 MED ORDER — ATORVASTATIN CALCIUM 20 MG PO TABS: 40.0000 mg | ORAL_TABLET | Freq: Every day | ORAL | Status: AC

## 2011-12-12 MED ORDER — CLOPIDOGREL BISULFATE 75 MG PO TABS: 75.0000 mg | ORAL_TABLET | Freq: Every day | ORAL | Status: DC

## 2011-12-12 NOTE — Discharge Summary (Signed)
Physician Discharge Summary  Brent Acosta RUE:[id number] DOB: Aug 12, 1950 DOA: 12/10/2011  PCP: Reynolds Bowl, MD  Admit date: 12/10/2011 Discharge date: 12/12/2011  Time spent: 35 minutes  Recommendations for Outpatient Follow-up:  1. Patient will be discharged to SNF for short term rehabilitation  Discharge Diagnoses:  Principal Problem:  *Stroke Active Problems:  CKD (chronic kidney disease) stage 3, GFR 30-59 ml/min  Left hemiparesis  Hypertension  Gout  Hyperlipidemia BPH  Discharge Condition: stable  Diet recommendation: low salt  Filed Weights   12/10/11 1300  Weight: 87.998 kg (194 lb)    History of present illness:  Brent Acosta is a 61 y.o. male with a history of hypertension, gout, hyperlipidemia. Patient was in her usual state of health when approximately 2 days ago he noticed onset of left-sided weakness. He does have chronic right-sided weakness or prior stroke. He denies any changes in his vision, difficulty with swallowing. His family does report he had noticed some difficulty with his speech. He was having trouble ambulating. He sought medical treatment at [facility]. He was told that he had a urinary tract infection, and was discharged home on an antibiotic. The patient's symptoms continued to get worse/did not improve. Today he was found at home, after having a fall. He was brought to the ER for evaluation, he was found to have an acute stroke. Patient has been admitted to the hospital for further treatment.  Patient also reports occasional difficulty initiating urine. He admits to frequent nocturia. He has been told he has enlarged prostate.   Hospital Course:  This gentleman was admitted to the hospital with left sided weakness.  He was found to have a acute infarct in the right MCA distribution.  Patient was not a candidate for tPA due to delay in patient arrival.  He presented to our ED approximately 48 hours since the onset of his symptoms. MRI of  the brain confirmed acute stroke.  MRA of the head did indicate significant intracranial atherosclerotic disease.  This will be managed with aggressive medical management. He was taking aspirin at home prior to admission and was therefore changed to plavix. Carotid dopplers did not indicate any significant stenosis.  2D echo is currently pending and will hopefully be done tomorrow.  Lipid panel showed that LDL was above goal at 135, so lipitor dose increased. Hgba1c was checked and normal.  His blood pressure is stable and antihypertensives can be adjusted as an outpatient.  He does have some left sided hemiparesis.  He was seen by physical therapy and SNF placement was recommended.    Patient described nocturia and difficulty initiating urine stream.  He has been started on flomax.  Creatinine was initially elevated on admission at 1.5, but then improved with IV fluids to 1.27.  He can likely be discharged in the next 24 hours after echo is complete.  Procedures:  2D echo pending  Consultations:  none  Discharge Exam: Filed Vitals:   12/11/11 2033 12/11/11 2105 12/12/11 0221 12/12/11 0449  BP:  155/100 160/98 146/92  Pulse: 76 67 69 65  Temp:  98.6 F (37 C) 98.2 F (36.8 C) 97.9 F (36.6 C)  TempSrc:  Oral Oral Oral  Resp: 16 18 18 17   Height:      Weight:      SpO2: 98% 97% 97% 95%    General: NAD Cardiovascular: S1, S2 RRR Respiratory: CTA B Neuro: left sided hemiparesis, left sided homonymous hemianopsia  Discharge Instructions  Medication List     As of 12/12/2011  5:13 PM    STOP taking these medications         aspirin 81 MG chewable tablet      TAKE these medications         amLODipine 5 MG tablet   Commonly known as: NORVASC   Take 10 mg by mouth daily.      atorvastatin 20 MG tablet   Commonly known as: LIPITOR   Take 2 tablets (40 mg total) by mouth daily.      clopidogrel 75 MG tablet   Commonly known as: PLAVIX   Take 1 tablet (75 mg  total) by mouth daily with breakfast.      colchicine 0.6 MG tablet   Take 0.6 mg by mouth daily as needed.      DULoxetine 60 MG capsule   Commonly known as: CYMBALTA   Take 60 mg by mouth daily.      hydrochlorothiazide 25 MG tablet   Commonly known as: HYDRODIURIL   Take 25 mg by mouth daily.      labetalol 200 MG tablet   Commonly known as: NORMODYNE   Take 200 mg by mouth 3 (three) times daily.      nitroGLYCERIN 0.4 MG SL tablet   Commonly known as: NITROSTAT   Place 0.4 mg under the tongue every 5 (five) minutes as needed. Chest Pain      quinapril 40 MG tablet   Commonly known as: ACCUPRIL   Take 40 mg by mouth 2 (two) times daily.      Tamsulosin HCl 0.4 MG Caps   Commonly known as: FLOMAX   Take 1 capsule (0.4 mg total) by mouth daily after supper.      tiZANidine 4 MG tablet   Commonly known as: ZANAFLEX   Take 4 mg by mouth every 6 (six) hours as needed. Muscle Spasms           The results of significant diagnostics from this hospitalization (including imaging, microbiology, ancillary and laboratory) are listed below for reference.    Significant Diagnostic Studies: Ct Head Wo Contrast  12/10/2011  *RADIOLOGY REPORT*  Clinical Data: Weakness, altered level of consciousness  CT HEAD WITHOUT CONTRAST  Technique:  Contiguous axial images were obtained from the base of the skull through the vertex without contrast.  Comparison: CT 10/02/2008.  Findings: There is new hypodense lesions within the right lentiform nucleus and head of the caudate nucleus consistent with acute or subacute infarction.  Again demonstrated subcortical hypodensity in the left parietal lobe.  No intracranial hemorrhage.  No hydrocephalus.  Basilar cisterns are patent.  Paranasal sinuses and mastoid air cells are clear.  Orbits are normal.  IMPRESSION:  1.  Acute versus subacute infarctions within the right basal ganglia (lentiform nucleus and caudate). 2.  Remote subcortical infarction in the  left parietal lobe.   Original Report Authenticated By: Genevive Bi, M.D.    Mr [facility] Wo Contrast  12/10/2011  *RADIOLOGY REPORT*  Clinical Data: Stroke  MRA HEAD WITHOUT CONTRAST  Technique: Angiographic images of the Circle of Willis were obtained using MRA technique without intravenous contrast.  Comparison: CT head 12/10/2011  Findings: Both vertebral arteries are patent to the basilar.  Left vertebral artery is dominant.  Right PICA is patent.  Left PICA not visualized.  Basilar is widely patent.  Proximal posterior cerebral arteries are patent.  There is diffuse disease in the left posterior cerebral artery which  is moderately severe.  Mild disease in the right posterior cerebral artery.  Internal carotid artery is patent bilaterally.  There is focal signal loss in the anterior genu of the cavernous carotid bilaterally, felt to be artifact.  Anterior and middle cerebral arteries are patent.  There is a severe stenosis at the right M1 bifurcation with irregularity in the right middle cerebral artery branches bilaterally.  This corresponds to an area of acute infarct.  See separate MRI brain report.  Mild to moderate atherosclerotic disease in left middle cerebral artery branches. Mild disease and anterior cerebral arteries.  Negative for cerebral aneurysm.  IMPRESSION: Significant intracranial atherosclerotic disease with moderate disease in the left posterior cerebral artery and mild disease in the right posterior cerebral artery.  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   Original Report Authenticated By: Janeece Riggers, M.D.    Mr Brain Wo Contrast  12/10/2011  *RADIOLOGY REPORT*  Clinical Data: Stroke.  Left-sided weakness  MRI HEAD WITHOUT CONTRAST  Technique:  Multiplanar, multiecho pulse sequences of the brain and surrounding structures were obtained according to standard protocol without intravenous contrast.  Comparison:  CT head 12/10/2011  Findings: Acute right MCA infarct.  There is acute infarct involving the right putamen and caudate.  There is also a small area of acute infarct in the  right     frontal temporal lobe.  No other areas of acute infarct.  Chronic left parietal infarct.  Chronic microvascular ischemia in the white matter.  Mild chronic ischemia in the pons.  Small chronic infarct left thalamus.  Mild chronic micro hemorrhage right parietal white matter.  No mass lesion or midline shift is present.  Mild chronic sinusitis.  IMPRESSION: Acute non hemorrhagic infarct right MCA territory involving the basal ganglia and right frontal temporal lobe.  Moderate chronic ischemic changes.   Original Report Authenticated By: Janeece Riggers, M.D.    US Carotid Duplex Bilateral  12/11/2011  *RADIOLOGY REPORT*  Clinical Data: Acute right MCA distribution stroke identified on MRI yesterday.  BILATERAL CAROTID DUPLEX ULTRASOUND  Technique: Wallace Cullens scale imaging, color Doppler and duplex ultrasound was performed of bilateral carotid and vertebral arteries in the neck.  Comparison:  None.  Criteria:  Quantification of carotid stenosis is based on velocity parameters that correlate the residual internal carotid diameter with NASCET-based stenosis levels, using the diameter of the distal internal carotid lumen as the denominator for stenosis measurement.  The following velocity measurements were obtained:                   PEAK SYSTOLIC/END DIASTOLIC RIGHT ICA:                        54/21cm/sec CCA:                        54/15cm/sec SYSTOLIC ICA/CCA RATIO:     1.00 DIASTOLIC ICA/CCA RATIO:    1.43 ECA:                        115/14cm/sec  LEFT ICA:                        72/25cm/sec CCA:                        73/19cm/sec SYSTOLIC ICA/CCA RATIO:  0.98 DIASTOLIC ICA/CCA RATIO:    1.30 ECA:                        168/21cm/sec  Findings:  RIGHT CAROTID ARTERY: Intimal thickening involving the CCA.  Mixed calcified and noncalcified  plaque in the carotid bulb without visible stenosis of greater than 50% diameter at gray scale or color imaging.  Spectral broadening involving the ICA waveform.  RIGHT VERTEBRAL ARTERY:  Antegrade flow with normal waveform.  LEFT CAROTID ARTERY: Intimal thickening involving the CCA. Predominately noncalcified plaque in the carotid bulb without visible stenosis of greater than 50% diameter at gray scale or color imaging.  Spectral broadening involving the ICA waveform.  LEFT VERTEBRAL ARTERY:  Antegrade flow with normal waveform.  IMPRESSION:  1.  No evidence of hemodynamically significant stenosis involving either the right or left carotid circulation in the neck by Doppler criteria. 2.  Mixed calcified and noncalcified plaque in the right carotid bulb and predominately noncalcified plaque in the left carotid bulb. 3.  Antegrade flow in both vertebral arteries.   Original Report Authenticated By: Hulan Saas, M.D.    Dg Chest Portable 1 View  12/10/2011  *RADIOLOGY REPORT*  Clinical Data: Weakness, shortness of breath.  PORTABLE CHEST - 1 VIEW  Comparison: 04/11/2009  Findings: Heart is borderline in size.  No confluent airspace opacities.  No effusions.  No acute bony abnormality.  IMPRESSION: Borderline heart size. No active cardiopulmonary disease.   Original Report Authenticated By: Charlett Nose, M.D.     Microbiology: No results found for this or any previous visit (from the past 240 hour(s)).   Labs: Basic Metabolic Panel:  Lab 12/12/11 1191 12/11/11 0637 12/10/11 0813  NA 141 140 139  K 3.5 3.0* 3.5  CL 107 103 98  CO2 25 27 27   GLUCOSE 105* 102* 108*  BUN 18 24* 25*  CREATININE 1.27 1.39* 1.51*  CALCIUM 9.4 8.9 10.1  MG -- -- --  PHOS -- -- --   Liver Function Tests:  Lab 12/10/11 0813  AST 19  ALT 17  ALKPHOS 46  BILITOT 0.6  PROT 7.7  ALBUMIN 3.9   No results found for this basename: LIPASE:5,AMYLASE:5 in the last 168 hours No results found for this basename:  AMMONIA:5 in the last 168 hours CBC:  Lab 12/10/11 0813  WBC 10.9*  NEUTROABS 8.5*  HGB 15.9  HCT 47.6  MCV 93.2  PLT 223   Cardiac Enzymes:  Lab 12/10/11 0813  CKTOTAL 187  CKMB --  CKMBINDEX --  TROPONINI <0.30   BNP: BNP (last 3 results) No results found for this basename: PROBNP:3 in the last 8760 hours CBG: No results found for this basename: GLUCAP:5 in the last 168 hours     Signed:  Booker Bhatnagar  [facility] 12/12/2011, 5:13 PM

## 2011-12-12 NOTE — Progress Notes (Addendum)
Physical Therapy Treatment Patient Details Name: Brent Acosta MRN: [id number] DOB: [birth year] Today's Date: 12/12/2011 Time: 0910-1000 PT Time Calculation (min): 50 min  PT Assessment / Plan / Recommendation Comments on Treatment Session  Pt with tendency to gaze right but has ability to gaze left with cues.  Session focus on dynamic sitting and standing balance and maintaining neutral gaze, pt did require constant cueing to complete.  Pt unable to walk straight forwards, required cueing and manual assistance when walking into walls.  Gait training with RW min assistance with decreased left standce phase and decreased hip/knee flexion bilareral.  Pt tolerated well towards total treatment.    Follow Up Recommendations        Does the patient have the potential to tolerate intense rehabilitation     Barriers to Discharge        Equipment Recommendations       Recommendations for Other Services    Frequency     Plan      Precautions / Restrictions Precautions Precautions: Fall       Mobility  Bed Mobility Supine to Sit: 5: Supervision Sit to Supine: 5: Supervision Details for Bed Mobility Assistance: cueing for motor planning and hand placement Transfers Transfers: Sit to Stand;Stand to Sit Sit to Stand: 4: Min guard;With upper extremity assist;From bed;From toilet Stand to Sit: 4: Min guard;With upper extremity assist;To bed;To toilet Ambulation/Gait Ambulation/Gait Assistance: 4: Min assist Ambulation Distance (Feet): 150 Feet Assistive device: Rolling walker Ambulation/Gait Assistance Details: pt with tendency to go left, requires cueing and manual assistance to walk straight Gait Pattern: Decreased hip/knee flexion - left;Decreased hip/knee flexion - right;Decreased stance time - left;Wide base of support    Exercises General Exercises - Lower Extremity Toe Raises: Both;10 reps;Standing Heel Raises: Both;10 reps;Standing Mini-Sqauts: Strengthening;Both;10  reps;Standing   PT Diagnosis:    PT Problem List:   PT Treatment Interventions:     PT Goals Acute Rehab PT Goals PT Goal: Supine/Side to Sit - Progress: Met PT Goal: Stand - Progress: Progressing toward goal PT Goal: Ambulate - Progress: Met  Visit Information       Subjective Data  Subjective: I'm ok no pain   Cognition  Overall Cognitive Status: Appears within functional limits for tasks assessed/performed Arousal/Alertness: Awake/alert Orientation Level: Appears intact for tasks assessed Behavior During Session: [facility] for tasks performed    Balance  Static Sitting Balance Static Sitting - Balance Support: No upper extremity supported Static Sitting - Level of Assistance: 4: Min assist Dynamic Sitting Balance Dynamic Sitting - Balance Support: No upper extremity supported Dynamic Sitting - Level of Assistance: 4: Min Oncologist Standing - Balance Support: No upper extremity supported Static Standing - Level of Assistance: 4: Min assist High Level Balance High Level Balance Activites: Side stepping;Backward walking;Direction changes;Sudden stops;Head turns High Level Balance Comments: pt with mod assistance with activities Sitting: BUE shoulder flexion maintaining balance in neutral Sitting: BUE reaching maintaining balance in neutral Standing: weight shifting R/L and A/P and standing x with feet side by side   End of Session PT - End of Session Equipment Utilized During Treatment: Gait belt Activity Tolerance: Patient tolerated treatment well Patient left: in bed;with call bell/phone within reach;with bed alarm set Nurse Communication: Mobility status   GP     Juel Burrow 12/12/2011, 9:59 AM

## 2011-12-13 DIAGNOSIS — I10 Essential (primary) hypertension: Secondary | ICD-10-CM

## 2011-12-13 DIAGNOSIS — G8190 Hemiplegia, unspecified affecting unspecified side: Secondary | ICD-10-CM

## 2011-12-13 MED ORDER — POTASSIUM CHLORIDE ER 10 MEQ PO TBCR: 20.0000 meq | EXTENDED_RELEASE_TABLET | Freq: Every day | ORAL | Status: DC

## 2011-12-13 MED ORDER — POTASSIUM CHLORIDE CRYS ER 20 MEQ PO TBCR
20.0000 meq | EXTENDED_RELEASE_TABLET | Freq: Every day | ORAL | Status: AC
  Administered 2011-12-13: 20 meq via ORAL
  Filled 2011-12-13: qty 1

## 2011-12-13 NOTE — Clinical Social Work Placement (Signed)
Clinical Social Work Department CLINICAL SOCIAL WORK PLACEMENT NOTE 12/13/2011  Patient:  Brent Acosta, Brent Acosta  Account Number:  [id number] Admit date:  12/10/2011  Clinical Social Worker:  Derenda Fennel, LCSW   Date/time:  12/13/2011 09:37 AM  Clinical Social Work is seeking post-discharge placement for this patient at the following level of care:   SKILLED NURSING   (*CSW will update this form in Epic as items are completed)   12/13/2011  Patient/family provided with [facility] Department of Clinical Social Work's list of facilities offering this level of care within the geographic area requested by the patient (or if unable, by the patient's family).  12/13/2011  Patient/family informed of their freedom to choose among providers that offer the needed level of care, that participate in Medicare, Medicaid or managed care program needed by the patient, have an available bed and are willing to accept the patient.  12/13/2011  Patient/family informed of [facility]' ownership interest in [facility], as well as of the fact that they are under no obligation to receive care at this facility.  PASARR submitted to EDS on 12/13/2011 PASARR number received from EDS on 12/13/2011  FL2 transmitted to all facilities in geographic area requested by pt/family on  12/13/2011 FL2 transmitted to all facilities within larger geographic area on   Patient informed that his/her managed care company has contracts with or will negotiate with  certain facilities, including the following:     Patient/family informed of bed offers received:  12/13/2011 Patient chooses bed at [facility] [place] Physician recommends and patient chooses bed at  [facility] [place]  Patient to be transferred to [facility]  on  12/13/2011 Patient to be transferred to facility by family  The following physician request were entered in Epic:   Additional Comments:  Derenda Fennel, LCSW [phone]

## 2011-12-13 NOTE — Evaluation (Signed)
Speech Language Pathology Evaluation Patient Details Name: Brent Acosta MRN: [id number] DOB: [birth year] Today's Date: 12/13/2011 Time: 2130-8657 SLP Time Calculation (min): 15 min  Problem List:  Patient Active Problem List  Diagnosis  . Lumbago  . 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 Stroke  . CKD (chronic kidney disease) stage 3, GFR 30-59 ml/min  . Left hemiparesis  . Hypertension  . Gout  . Hyperlipidemia   Past Medical History:  Past Medical History  Diagnosis Date  . 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 HTN (hypertension)   . Gout     big toe   Past Surgical History: History reviewed. No pertinent past surgical history. HPI:  Brent Acosta is a 61 y.o. male brought by relative to the Emergency Department complaining of 1 day of recurrent, progressive, constant, moderate weakness. Pt denies pain. He was seen for similar symptoms yesterday at [facility] yesterday and Dx with UTI. Pt reports no improvement, and per relative, experience difficulty gaiting today. Bowels and bladder are normal. Symptoms have not been treated PTA. No fever, chills, diaphoresis, cough, congestion, rhinorrhea, chest pain, SOB, or n/v/d. Pt admits heavy tobacco use, and denies consumption of alcohol and use of illicit drugs.  CT revealed subacute infarct in the right basal ganglia and right temp lobe.  Assessment / Plan / Recommendation Clinical Impression  Pt presents with mild-mod cognitive deficits s/p right temporal and basal gangia CVA.  Presents with impaired sustained attention and storage of new information, inattention to left space, decreased intellectual awareness of deficits and disorientation to time and situation.  Rec.  SLP f/u for cognition in next venue of care (SNF).      SLP Assessment  All further Speech Lanaguage Pathology  needs can be addressed in the next venue of care    Follow Up Recommendations  Skilled Nursing facility    Frequency and Duration   n/a       Pertinent Vitals/Pain No pain   SLP Goals     SLP Evaluation Prior Functioning  Cognitive/Linguistic Baseline: Within functional limits Type of Home: House Lives With: Friend(s) Available Help at Discharge: Friend(s);Available PRN/intermittently   Cognition  Overall Cognitive Status: Impaired Arousal/Alertness: Awake/alert Orientation Level: Oriented to person;Oriented to place;Disoriented to time;Disoriented to situation Attention: Sustained Sustained Attention: Impaired Sustained Attention Impairment: Verbal basic Memory: Impaired Memory Impairment: Storage deficit Awareness: Impaired Awareness Impairment: Intellectual impairment Behaviors: Other (comment) (flat affect) Safety/Judgment: Impaired    Comprehension  Auditory Comprehension Overall Auditory Comprehension: Appears within functional limits for tasks assessed Visual Recognition/Discrimination Discrimination: Within Function Limits Reading Comprehension Reading Status: Not tested    Expression Expression Primary Mode of Expression: Verbal Verbal Expression Overall Verbal Expression: Appears within functional limits for tasks assessed Written Expression Dominant Hand: Right Written Expression: Not tested   Oral / Motor Oral Motor/Sensory Function Overall Oral Motor/Sensory Function: Appears within functional limits for tasks assessed Motor Speech Overall Motor Speech: Appears within functional limits for tasks assessed   GO   Luma Clopper L. Samson Frederic, [place] CCC/SLP Pager [phone]   Blenda Mounts Laurice 12/13/2011, 11:08 AM

## 2011-12-13 NOTE — Progress Notes (Signed)
The patient was briefly examined. He has no acute or new complaints. His chart, vitals signs, and laboratory data were reviewed. He is hemodynamically stable, though hypertensive. With titration of his blood pressure medications over time, hopefully he will achieve better blood pressure control. He is stable for discharge. Please see Dr. Benetta Spar discharge summary. The only change was that potassium chloride was added at 20 mEq daily. The results of the 2-D echocardiogram are pending at the time of discharge. These results will need to be reviewed by the physician at the skilled nursing facility.

## 2011-12-13 NOTE — Progress Notes (Signed)
Patient discharged to [facility], report called,and given to [facility] LPN.Vital signs stable,no c/o pain or discomfort noted. Accompanied by staff to awaiting vehicle,family to transport patient to facility.

## 2011-12-13 NOTE — Clinical Social Work Note (Signed)
CSW presented bed offer to pt and pt is agreeable to [facility]. Pt's sister also present. D/C summary faxed to [facility]. Medicaid prior approval initiated. Letter of guarantee faxed by supervisor to facility. Pt to transfer with sister.   Derenda Fennel, [place] [phone]

## 2011-12-13 NOTE — Progress Notes (Signed)
*  PRELIMINARY RESULTS* Echocardiogram 2D Echocardiogram has been performed.  Conrad Altus 12/13/2011, 10:07 AM

## 2011-12-13 NOTE — Clinical Social Work Placement (Signed)
Clinical Social Work Department CLINICAL SOCIAL WORK PLACEMENT NOTE 12/13/2011  Patient:  DASHTON, CZERWINSKI  Account Number:  [id number] Admit date:  12/10/2011  Clinical Social Worker:  Derenda Fennel, LCSW  Date/time:  12/13/2011 09:37 AM  Clinical Social Work is seeking post-discharge placement for this patient at the following level of care:   SKILLED NURSING   (*CSW will update this form in Epic as items are completed)   12/13/2011  Patient/family provided with [facility] Department of Clinical Social Work's list of facilities offering this level of care within the geographic area requested by the patient (or if unable, by the patient's family).  12/13/2011  Patient/family informed of their freedom to choose among providers that offer the needed level of care, that participate in Medicare, Medicaid or managed care program needed by the patient, have an available bed and are willing to accept the patient.  12/13/2011  Patient/family informed of [facility]' ownership interest in [facility], as well as of the fact that they are under no obligation to receive care at this facility.  PASARR submitted to EDS on 12/13/2011 PASARR number received from EDS on 12/13/2011  FL2 transmitted to all facilities in geographic area requested by pt/family on  12/13/2011 FL2 transmitted to all facilities within larger geographic area on   Patient informed that his/her managed care company has contracts with or will negotiate with  certain facilities, including the following:     Patient/family informed of bed offers received:   Patient chooses bed at  Physician recommends and patient chooses bed at    Patient to be transferred to  on   Patient to be transferred to facility by   The following physician request were entered in Epic:   Additional Comments:  Derenda Fennel, LCSW [phone]

## 2011-12-13 NOTE — Clinical Social Work Psychosocial (Signed)
Clinical Social Work Department BRIEF PSYCHOSOCIAL ASSESSMENT 12/13/2011  Patient:  Brent Acosta, Brent Acosta     Account Number:  [id number]     Admit date:  12/10/2011  Clinical Social Worker:  Nancie Neas  Date/Time:  12/13/2011 09:30 AM  Referred by:  Physician  Date Referred:  12/13/2011 Referred for  SNF Placement   Other Referral:   Interview type:  Patient Other interview type:    PSYCHOSOCIAL DATA Living Status:  FRIEND(S) Admitted from facility:   Level of care:   Primary support name:  Casimiro Needle Primary support relationship to patient:  FRIEND Degree of support available:   adequate per pt    CURRENT CONCERNS Current Concerns  Post-Acute Placement   Other Concerns:    SOCIAL WORK ASSESSMENT / PLAN CSW met with pt at bedside following referral from MD for SNF. Pt alert and oriented and reports he lives at home with a friend who has been helping him out. Pt said he does not have family and that Russella Dar is his best support. Prior to stroke, pt indicates he was independent. During assessment, pt gazed to the right which is also what he did during PT treatment. CSW discussed placement process and pt is agreeable to SNF. SNF list provided. Pt lives in [place] and would like to stay as close to [place] as possible. CSW will fax out FL2 and follow up with bed offers when available. Pt has Medicaid and CSW will contact supervisor for LOG. Pt medically stable for d/c.   Assessment/plan status:  Psychosocial Support/Ongoing Assessment of Needs Other assessment/ plan:   Information/referral to community resources:   SNF list    PATIENT'S/FAMILY'S RESPONSE TO PLAN OF CARE: Pt agreeable to [facility] SN[facility] for rehab prior to returning home.        Derenda Fennel, [place] [phone]

## 2011-12-13 NOTE — Progress Notes (Signed)
Utilization Review Complete  

## 2011-12-13 NOTE — Care Management Note (Signed)
    Page 1 of 1   12/13/2011     1:13:50 PM   CARE MANAGEMENT NOTE 12/13/2011  Patient:  Brent Acosta, Brent Acosta   Account Number:  [id number]  Date Initiated:  12/13/2011  Documentation initiated by:  Rosemary Holms  Subjective/Objective Assessment:   Pt admitted from home where he lives alone. CVA.     Action/Plan:   Anticipated DC Date:  12/13/2011   Anticipated DC Plan:  SKILLED NURSING FACILITY  In-house referral  Clinical Social Worker      DC Planning Services  CM consult      Choice offered to / List presented to:             Status of service:  Completed, signed off Medicare Important Message given?   (If response is "NO", the following Medicare IM given date fields will be blank) Date Medicare IM given:   Date Additional Medicare IM given:    Discharge Disposition:  SKILLED NURSING FACILITY  Per UR Regulation:    If discussed at Long Length of Stay Meetings, dates discussed:    Comments:  12/13/11 Rosemary Holms RN BSN CM

## 2011-12-15 NOTE — Progress Notes (Signed)
Late Entry by C. Ardyn Forge RN   Physical Therapy had been in to work with pt. Pt was then assisted to recliner with all safety measures in place, call bell, chair alarm, overbed table in place with urinal, telephone, and nourishments within pt reach. NT and RN was alerted to room when chair alarm went off. Pt was found in sitting position on floor with back supported by his bed. When questioned by RN , Pt stated he suffered no injury. Pt was assisted by personnel back to bed and Dr. Kerry Hough was notified. No further action was taken. Pt appeared without pain or discomfort. Family was present and informed of fall. Family was aware and stated falls were happening more frequently at home prior to this incident.

## 2012-03-11 ENCOUNTER — Emergency Department (HOSPITAL_COMMUNITY): Payer: Medicaid Other

## 2012-03-11 ENCOUNTER — Inpatient Hospital Stay (HOSPITAL_COMMUNITY)
Admission: EM | Admit: 2012-03-11 | Discharge: 2012-03-15 | DRG: 101 | Disposition: A | Discharge status: Home / Self Care | Payer: Medicaid Other | Attending: Family Medicine | Admitting: Family Medicine

## 2012-03-11 ENCOUNTER — Encounter (HOSPITAL_COMMUNITY): Payer: Self-pay | Admitting: Family Medicine

## 2012-03-11 DIAGNOSIS — I4891 Unspecified atrial fibrillation: Secondary | ICD-10-CM | POA: Diagnosis present

## 2012-03-11 DIAGNOSIS — R4182 Altered mental status, unspecified: Secondary | ICD-10-CM | POA: Diagnosis present

## 2012-03-11 DIAGNOSIS — N189 Chronic kidney disease, unspecified: Secondary | ICD-10-CM | POA: Diagnosis present

## 2012-03-11 DIAGNOSIS — I129 Hypertensive chronic kidney disease with stage 1 through stage 4 chronic kidney disease, or unspecified chronic kidney disease: Secondary | ICD-10-CM | POA: Diagnosis present

## 2012-03-11 DIAGNOSIS — I10 Essential (primary) hypertension: Secondary | ICD-10-CM

## 2012-03-11 DIAGNOSIS — M109 Gout, unspecified: Secondary | ICD-10-CM | POA: Diagnosis present

## 2012-03-11 DIAGNOSIS — Z7902 Long term (current) use of antithrombotics/antiplatelets: Secondary | ICD-10-CM

## 2012-03-11 DIAGNOSIS — Z91199 Patient's noncompliance with other medical treatment and regimen due to unspecified reason: Secondary | ICD-10-CM

## 2012-03-11 DIAGNOSIS — I4892 Unspecified atrial flutter: Secondary | ICD-10-CM | POA: Diagnosis present

## 2012-03-11 DIAGNOSIS — I639 Cerebral infarction, unspecified: Secondary | ICD-10-CM | POA: Diagnosis present

## 2012-03-11 DIAGNOSIS — F17200 Nicotine dependence, unspecified, uncomplicated: Secondary | ICD-10-CM | POA: Diagnosis present

## 2012-03-11 DIAGNOSIS — G40909 Epilepsy, unspecified, not intractable, without status epilepticus: Principal | ICD-10-CM | POA: Diagnosis present

## 2012-03-11 DIAGNOSIS — Z79899 Other long term (current) drug therapy: Secondary | ICD-10-CM

## 2012-03-11 DIAGNOSIS — Z8673 Personal history of transient ischemic attack (TIA), and cerebral infarction without residual deficits: Secondary | ICD-10-CM

## 2012-03-11 LAB — POCT I-STAT, CHEM 8
HCT: 46 % (ref 39.0–52.0)
Hemoglobin: 15.6 g/dL (ref 13.0–17.0)
Potassium: 3.6 mEq/L (ref 3.5–5.1)
Sodium: 141 mEq/L (ref 135–145)

## 2012-03-11 MED ORDER — TAMSULOSIN HCL 0.4 MG PO CAPS
0.4000 mg | ORAL_CAPSULE | Freq: Every day | ORAL | Status: DC
  Administered 2012-03-12 – 2012-03-14 (×3): 0.4 mg via ORAL
  Filled 2012-03-11 (×4): qty 1

## 2012-03-11 MED ORDER — ATORVASTATIN CALCIUM 40 MG PO TABS
40.0000 mg | ORAL_TABLET | Freq: Every day | ORAL | Status: DC
  Administered 2012-03-12 – 2012-03-15 (×4): 40 mg via ORAL
  Filled 2012-03-11 (×4): qty 1

## 2012-03-11 MED ORDER — CLOPIDOGREL BISULFATE 75 MG PO TABS
75.0000 mg | ORAL_TABLET | Freq: Every day | ORAL | Status: DC
  Administered 2012-03-12: 75 mg via ORAL
  Filled 2012-03-11: qty 1

## 2012-03-11 MED ORDER — COLCHICINE 0.6 MG PO TABS
0.6000 mg | ORAL_TABLET | Freq: Every day | ORAL | Status: DC
  Administered 2012-03-12 – 2012-03-15 (×4): 0.6 mg via ORAL
  Filled 2012-03-11 (×4): qty 1

## 2012-03-11 MED ORDER — ACETAMINOPHEN 650 MG RE SUPP: 650.0000 mg | Freq: Four times a day (QID) | RECTAL | Status: DC | PRN

## 2012-03-11 MED ORDER — PHENYTOIN SODIUM EXTENDED 100 MG PO CAPS
400.0000 mg | ORAL_CAPSULE | Freq: Once | ORAL | Status: AC
  Administered 2012-03-11: 400 mg via ORAL
  Filled 2012-03-11: qty 4

## 2012-03-11 MED ORDER — DULOXETINE HCL 60 MG PO CPEP
60.0000 mg | ORAL_CAPSULE | Freq: Every day | ORAL | Status: DC
  Administered 2012-03-12 – 2012-03-15 (×4): 60 mg via ORAL
  Filled 2012-03-11 (×4): qty 1

## 2012-03-11 MED ORDER — SODIUM CHLORIDE 0.9 % IJ SOLN
3.0000 mL | Freq: Two times a day (BID) | INTRAMUSCULAR | Status: DC
  Administered 2012-03-12 – 2012-03-15 (×3): 3 mL via INTRAVENOUS

## 2012-03-11 MED ORDER — ACETAMINOPHEN 325 MG PO TABS: 650.0000 mg | ORAL_TABLET | Freq: Four times a day (QID) | ORAL | Status: DC | PRN

## 2012-03-11 MED ORDER — PHENYTOIN 100 MG/4ML PO SUSP: 125.0000 mg | Freq: Three times a day (TID) | ORAL | Status: DC

## 2012-03-11 MED ORDER — ENOXAPARIN SODIUM 80 MG/0.8ML ~~LOC~~ SOLN
80.0000 mg | SUBCUTANEOUS | Status: AC
  Administered 2012-03-11: 80 mg via SUBCUTANEOUS
  Filled 2012-03-11: qty 0.8

## 2012-03-11 MED ORDER — ONDANSETRON HCL 4 MG PO TABS: 4.0000 mg | ORAL_TABLET | Freq: Four times a day (QID) | ORAL | Status: DC | PRN

## 2012-03-11 MED ORDER — PHENYTOIN 125 MG/5ML PO SUSP
125.0000 mg | Freq: Two times a day (BID) | ORAL | Status: DC
  Administered 2012-03-12: 125 mg via ORAL
  Filled 2012-03-11 (×3): qty 5

## 2012-03-11 MED ORDER — HYDROCHLOROTHIAZIDE 25 MG PO TABS
25.0000 mg | ORAL_TABLET | Freq: Every day | ORAL | Status: DC
  Filled 2012-03-11: qty 1

## 2012-03-11 MED ORDER — AMLODIPINE BESYLATE 10 MG PO TABS
10.0000 mg | ORAL_TABLET | Freq: Every day | ORAL | Status: DC
  Administered 2012-03-12 – 2012-03-15 (×4): 10 mg via ORAL
  Filled 2012-03-11 (×4): qty 1

## 2012-03-11 MED ORDER — ONDANSETRON HCL 4 MG/2ML IJ SOLN: 4.0000 mg | Freq: Three times a day (TID) | INTRAMUSCULAR | Status: DC | PRN

## 2012-03-11 MED ORDER — ONDANSETRON HCL 4 MG/2ML IJ SOLN: 4.0000 mg | Freq: Four times a day (QID) | INTRAMUSCULAR | Status: DC | PRN

## 2012-03-11 MED ORDER — NITROGLYCERIN 0.4 MG SL SUBL: 0.4000 mg | SUBLINGUAL_TABLET | SUBLINGUAL | Status: DC | PRN

## 2012-03-11 MED ORDER — ENOXAPARIN SODIUM 80 MG/0.8ML ~~LOC~~ SOLN
80.0000 mg | Freq: Two times a day (BID) | SUBCUTANEOUS | Status: DC
  Administered 2012-03-12 – 2012-03-13 (×3): 80 mg via SUBCUTANEOUS
  Filled 2012-03-11 (×4): qty 0.8

## 2012-03-11 MED ORDER — LORAZEPAM 2 MG/ML IJ SOLN: 1.0000 mg | INTRAMUSCULAR | Status: DC | PRN

## 2012-03-11 MED ORDER — LABETALOL HCL 200 MG PO TABS
200.0000 mg | ORAL_TABLET | Freq: Three times a day (TID) | ORAL | Status: DC
  Administered 2012-03-11 – 2012-03-15 (×11): 200 mg via ORAL
  Filled 2012-03-11 (×13): qty 1

## 2012-03-11 NOTE — ED Provider Notes (Signed)
History     CSN: [id number]  Arrival date & time 03/11/12  1623   First MD Initiated Contact with Patient 03/11/12 1636      Chief Complaint  Patient presents with  . Seizures     HPI Per EMS, family found pt seizing on the toilet today. When EMS arrived pt post-ictal and on the floor. Pt incontinent of urine and stool and N,V. patient has previous history of seizure disorder which started after his stroke.  He is currently taking Dilantin to control his seizures. Pt hypertensive. 190/120. CBG 122.   Past Medical History  Diagnosis Date  . 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 HTN (hypertension)   . Gout     big toe    History reviewed. No pertinent past surgical history.  History reviewed. No pertinent family history.  History  Substance Use Topics  . Smoking status: Heavy Tobacco Smoker -- 0.50 packs/day    Types: Cigarettes  . Smokeless tobacco: Not on file  . Alcohol Use: No      Review of Systems All other systems reviewed and are negative Allergies  Review of patient's allergies indicates no known allergies.  Home Medications   Current Outpatient Rx  Name  Route  Sig  Dispense  Refill  . amLODipine (NORVASC) 5 MG tablet   Oral   Take 10 mg by mouth daily.         Marland Kitchen atorvastatin (LIPITOR) 20 MG tablet   Oral   Take 2 tablets (40 mg total) by mouth daily.         . clopidogrel (PLAVIX) 75 MG tablet   Oral   Take 1 tablet (75 mg total) by mouth daily with breakfast.         . colchicine 0.6 MG tablet   Oral   Take 0.6 mg by mouth daily as needed.         . DULoxetine (CYMBALTA) 60 MG capsule   Oral   Take 60 mg by mouth daily.         . hydrochlorothiazide (HYDRODIURIL) 25 MG tablet   Oral   Take 50 mg by mouth daily.          Marland Kitchen labetalol (NORMODYNE) 200 MG tablet   Oral   Take 200 mg by mouth 3 (three) times daily.         . nitroGLYCERIN (NITROSTAT) 0.4 MG SL tablet   Sublingual   Place 0.4 mg under the tongue every 5  (five) minutes as needed. Chest Pain         . Tamsulosin HCl (FLOMAX) 0.4 MG CAPS   Oral   Take 1 capsule (0.4 mg total) by mouth daily after supper.   30 capsule      . Vitamin D, Ergocalciferol, (DRISDOL) 50000 UNITS CAPS   Oral   Take 50,000 Units by mouth every 7 (seven) days.         . phenytoin (DILANTIN) 100 MG/4ML suspension   Oral   Take 5 mLs (125 mg total) by mouth 3 (three) times daily.   240 mL   0     BP 149/82  Pulse 78  Temp(Src) 97.6 F (36.4 C) (Oral)  Resp 22  SpO2 86%  Physical Exam  Nursing note and vitals reviewed. Constitutional: He is oriented to person, place, and time. He appears well-developed and well-nourished. No distress.  HENT:  Head: Normocephalic and atraumatic.  Eyes: Pupils are equal, round,  and reactive to light.  Neck: Normal range of motion.  Cardiovascular: Normal rate and intact distal pulses.   Pulmonary/Chest: No respiratory distress.  Abdominal: Normal appearance. He exhibits no distension.  Musculoskeletal: Normal range of motion.  Neurological: He is alert and oriented to person, place, and time. No cranial nerve deficit. He exhibits normal muscle tone. He displays no seizure activity. GCS eye subscore is 4. GCS verbal subscore is 5. GCS motor subscore is 6.  Patient appears postictal but easily arousable  Skin: Skin is warm and dry. No rash noted.  Psychiatric: He has a normal mood and affect. His behavior is normal.    ED Course  Procedures (including critical care time)  Labs Reviewed  PHENYTOIN LEVEL, TOTAL - Abnormal; Notable for the following:    Phenytoin Lvl 0.3 (*)    All other components within normal limits  POCT I-STAT, CHEM 8 - Abnormal; Notable for the following:    BUN 25 (*)    Glucose, Bld 116 (*)    All other components within normal limits   Ct Head Wo Contrast  03/11/2012  *RADIOLOGY REPORT*  Clinical Data: Headache.  Seizure.  CT HEAD WITHOUT CONTRAST  Technique:  Contiguous axial images  were obtained from the base of the skull through the vertex without contrast.  Comparison: 12/10/2011  Findings: Areas of old infarction in the right occipital lobe, right frontal lobe and periventricular white matter extending into the right basal ganglia.  Old infarct in the left parietal lobe. Infarct within the right occipital lobe is new since prior CT and MRI, but appears chronic.  No acute infarction. No hemorrhage or hydrocephalus.  No midline shift.  No acute calvarial abnormality.  Mastoids are clear.  Visualized orbital soft tissues unremarkable.  IMPRESSION: Areas of old infarction bilaterally as described above.  No acute infarction visualized.   Original Report Authenticated By: Charlett Nose, M.D.      1. Seizure disorder   2. Noncompliance with medication regimen       MDM         Nelia Shi, MD 03/11/12 [phone]

## 2012-03-11 NOTE — ED Provider Notes (Addendum)
History     CSN: [id number]  Arrival date & time 03/11/12  1623   First MD Initiated Contact with Patient 03/11/12 1636      Chief Complaint  Patient presents with  . Seizures     HPI Patient brought in by family after being found having a seizure all going to the bathroom today.  When EMS arrived patient was postictal.  Was incontinent of urine and stool.  Had some nausea and vomiting.  Patient has history of seizure in November of 2013.  Also has history of hypertension and is currently taking Dilantin as a preventive.  Patient denies chest pain or palpitations. Past Medical History  Diagnosis Date  . 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 HTN (hypertension)   . Gout     big toe    History reviewed. No pertinent past surgical history.  History reviewed. No pertinent family history.  History  Substance Use Topics  . Smoking status: Heavy Tobacco Smoker -- 0.50 packs/day    Types: Cigarettes  . Smokeless tobacco: Not on file  . Alcohol Use: No      Review of Systems All other systems reviewed and are negative Allergies  Review of patient's allergies indicates no known allergies.  Home Medications   Current Outpatient Rx  Name  Route  Sig  Dispense  Refill  . amLODipine (NORVASC) 5 MG tablet   Oral   Take 10 mg by mouth daily.         Marland Kitchen atorvastatin (LIPITOR) 20 MG tablet   Oral   Take 2 tablets (40 mg total) by mouth daily.         . clopidogrel (PLAVIX) 75 MG tablet   Oral   Take 1 tablet (75 mg total) by mouth daily with breakfast.         . colchicine 0.6 MG tablet   Oral   Take 0.6 mg by mouth daily as needed.         . DULoxetine (CYMBALTA) 60 MG capsule   Oral   Take 60 mg by mouth daily.         . hydrochlorothiazide (HYDRODIURIL) 25 MG tablet   Oral   Take 50 mg by mouth daily.          Marland Kitchen labetalol (NORMODYNE) 200 MG tablet   Oral   Take 200 mg by mouth 3 (three) times daily.         . nitroGLYCERIN (NITROSTAT) 0.4 MG SL  tablet   Sublingual   Place 0.4 mg under the tongue every 5 (five) minutes as needed. Chest Pain         . Tamsulosin HCl (FLOMAX) 0.4 MG CAPS   Oral   Take 1 capsule (0.4 mg total) by mouth daily after supper.   30 capsule      . Vitamin D, Ergocalciferol, (DRISDOL) 50000 UNITS CAPS   Oral   Take 50,000 Units by mouth every 7 (seven) days.         . phenytoin (DILANTIN) 100 MG/4ML suspension   Oral   Take 5 mLs (125 mg total) by mouth 3 (three) times daily.   240 mL   0     BP 146/77  Pulse 50  Temp(Src) 97.6 F (36.4 C) (Oral)  Resp 22  SpO2 89%  Physical Exam  Nursing note and vitals reviewed. Constitutional: He is oriented to person, place, and time. He appears well-developed and well-nourished. No distress.  HENT:  Head: Normocephalic and atraumatic.  Eyes: Pupils are equal, round, and reactive to light.  Neck: Normal range of motion.  Cardiovascular: Normal rate and intact distal pulses.  An irregularly irregular rhythm present.  Pulmonary/Chest: No respiratory distress.  Abdominal: Normal appearance. He exhibits no distension. There is no tenderness. There is no rebound.  Musculoskeletal: Normal range of motion.  Neurological: He is alert and oriented to person, place, and time. No cranial nerve deficit. GCS eye subscore is 4. GCS verbal subscore is 5. GCS motor subscore is 6.  Skin: Skin is warm and dry. No rash noted.  Psychiatric: He has a normal mood and affect. His behavior is normal.    ED Course  Procedures (including critical care time)  Date: 03/11/2012  Rate: 82  Rhythm: Atrial flutter  QRS Axis: normal  Intervals: normal  ST/T Wave abnormalities: Nonspecific changes  Conduction Disutrbances: none  Narrative Interpretation: New-onset atrial flutter since previous EKGs in 2013     Labs Reviewed  PHENYTOIN LEVEL, TOTAL - Abnormal; Notable for the following:    Phenytoin Lvl 0.3 (*)    All other components within normal limits  POCT  I-STAT, CHEM 8 - Abnormal; Notable for the following:    BUN 25 (*)    Glucose, Bld 116 (*)    All other components within normal limits   Ct Head Wo Contrast  03/11/2012  *RADIOLOGY REPORT*  Clinical Data: Headache.  Seizure.  CT HEAD WITHOUT CONTRAST  Technique:  Contiguous axial images were obtained from the base of the skull through the vertex without contrast.  Comparison: 12/10/2011  Findings: Areas of old infarction in the right occipital lobe, right frontal lobe and periventricular white matter extending into the right basal ganglia.  Old infarct in the left parietal lobe. Infarct within the right occipital lobe is new since prior CT and MRI, but appears chronic.  No acute infarction. No hemorrhage or hydrocephalus.  No midline shift.  No acute calvarial abnormality.  Mastoids are clear.  Visualized orbital soft tissues unremarkable.  IMPRESSION: Areas of old infarction bilaterally as described above.  No acute infarction visualized.   Original Report Authenticated By: Charlett Nose, M.D.      1. Seizure disorder   2. Noncompliance with medication regimen   3. New onset atrial fibrillation       MDM        Nelia Shi, MD 03/11/12 2059  Nelia Shi, MD 03/11/12 2100

## 2012-03-11 NOTE — ED Notes (Signed)
MD at bedside. 

## 2012-03-11 NOTE — H&P (Addendum)
[facility] History and Physical  KHA HARI ZOX:[id number] DOB: 06/28/1950 DOA: 03/11/2012  Referring physician: Dr. Radford Pax PCP: Reynolds Bowl, MD in [place] [facility]:   Chief Complaint: Seizure  HPI: Brent Acosta is a 62 y.o. male with history of seizures, CVA in November of 2013, hypertension who presents with above complaints. He recently moved from our [place] currently lives here with his  niece, and it is reported that he had a seizure today while in the bathroom. His reports that she heard the sound coming from the bathroom and when she went to check it out, he was on the floor having a seizure. She reports that he has not been taking his Dilantin for at least 3 days now, because he ran out and they were still planning on getting it refilled. Patient denies any fevers, cough, chest pain, dizziness, paresthesias, new weakness and no melena. He was seen in the ED and his Dilantin level came back subtherapeutic at less than 3. He was given a 400 milligram by mouth dose of Dilantin, and plan was to discharge him home following after another by mouth dose. CT head was neg for acute infarction. An EKG that was obtained was reviewed and showed atrial flutter at the rate of 82. It was also noted that while in the ambulance patient had been in atrial fib/flutter. He is admitted for further evaluation and management.   Review of Systems: The patient denies anorexia, fever, weight loss,, vision loss, decreased hearing, hoarseness, chest pain, syncope, dyspnea on exertion, peripheral edema, balance deficits, hemoptysis, abdominal pain, melena, hematochezia, severe indigestion/heartburn, hematuria, incontinence, muscle weakness, transient blindness, difficulty walking, depression, unusual weight change, abnormal bleeding, enlarged lymph nodes,   Past Medical History  Diagnosis Date  . 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 HTN (hypertension)   . Gout     big toe   History reviewed. No  pertinent past surgical history. Social History:  reports that he has been smoking Cigarettes.  He has been smoking about 0.50 packs per day. He does not have any smokeless tobacco history on file. He reports that he does not drink alcohol or use illicit drugs. where does patient live--home,   Can patient participate in ADLs  No Known Allergies  Family history-his mom and dad had diabetes mellitus and strokes; his brother had seizures  Prior to Admission medications   Medication Sig Start Date End Date Taking? Authorizing Provider  amLODipine (NORVASC) 5 MG tablet Take 10 mg by mouth daily.   Yes Historical Provider, MD  atorvastatin (LIPITOR) 20 MG tablet Take 2 tablets (40 mg total) by mouth daily. 12/12/11  Yes Erick Blinks, MD  clopidogrel (PLAVIX) 75 MG tablet Take 1 tablet (75 mg total) by mouth daily with breakfast. 12/12/11  Yes Erick Blinks, MD  colchicine 0.6 MG tablet Take 0.6 mg by mouth daily as needed.   Yes Historical Provider, MD  DULoxetine (CYMBALTA) 60 MG capsule Take 60 mg by mouth daily.   Yes Historical Provider, MD  hydrochlorothiazide (HYDRODIURIL) 25 MG tablet Take 50 mg by mouth daily.    Yes Historical Provider, MD  labetalol (NORMODYNE) 200 MG tablet Take 200 mg by mouth 3 (three) times daily.   Yes Historical Provider, MD  nitroGLYCERIN (NITROSTAT) 0.4 MG SL tablet Place 0.4 mg under the tongue every 5 (five) minutes as needed. Chest Pain   Yes Historical Provider, MD  Tamsulosin HCl (FLOMAX) 0.4 MG CAPS Take 1 capsule (0.4 mg  total) by mouth daily after supper. 12/12/11  Yes Erick Blinks, MD  Vitamin D, Ergocalciferol, (DRISDOL) 50000 UNITS CAPS Take 50,000 Units by mouth every 7 (seven) days.   Yes Historical Provider, MD  phenytoin (DILANTIN) 100 MG/4ML suspension Take 5 mLs (125 mg total) by mouth 3 (three) times daily. 03/11/12   Nelia Shi, MD   Physical Exam: Filed Vitals:   03/11/12 1945 03/11/12 2015 03/11/12 2100 03/11/12 2115  BP: 129/86 146/77  138/87 140/95  Pulse: 70 50 76 81  Temp:      TempSrc:      Resp: 26 22 22 12   SpO2: 90% 89% 90% 97%    Constitutional: Vital signs reviewed.  Patient is a well-developed and well-nourished in no acute distress and cooperative with exam. Alert and oriented x3.  Head: Normocephalic and atraumatic Mouth: no erythema or exudates, MMM Eyes: PERRL, EOMI, conjunctivae normal, No scleral icterus.  Neck: Supple, Trachea midline normal ROM, No JVD, mass, thyromegaly, or carotid bruit present.  Cardiovascular: Irregular, rate controlled, S1 normal, S2 normal, no MRG, pulses symmetric and intact bilaterally Pulmonary/Chest: CTAB, no wheezes, rales, or rhonchi Abdominal: Soft. Non-tender, non-distended, bowel sounds are normal, no masses, organomegaly, or guarding present.  Extremities: No cyanosis and no edema  Neurological: A&O x3, Strength is 4+/ 5, cranial nerve II-XII are grossly intact, no focal motor deficit, sensory intact to light touch bilaterally.  Skin: Warm, dry and intact. No rash.  Psychiatric: Normal mood and affect.    Labs on Admission:  Basic Metabolic Panel:  Recent Labs Lab 03/11/12 1709  NA 141  K 3.6  CL 105  GLUCOSE 116*  BUN 25*  CREATININE 1.20   Liver Function Tests: No results found for this basename: AST, ALT, ALKPHOS, BILITOT, PROT, ALBUMIN,  in the last 168 hours No results found for this basename: LIPASE, AMYLASE,  in the last 168 hours No results found for this basename: AMMONIA,  in the last 168 hours CBC:  Recent Labs Lab 03/11/12 1709  HGB 15.6  HCT 46.0   Cardiac Enzymes: No results found for this basename: CKTOTAL, CKMB, CKMBINDEX, TROPONINI,  in the last 168 hours  BNP (last 3 results) No results found for this basename: PROBNP,  in the last 8760 hours CBG: No results found for this basename: GLUCAP,  in the last 168 hours  Radiological Exams on Admission: Ct Head Wo Contrast  03/11/2012  *RADIOLOGY REPORT*  Clinical Data: Headache.   Seizure.  CT HEAD WITHOUT CONTRAST  Technique:  Contiguous axial images were obtained from the base of the skull through the vertex without contrast.  Comparison: 12/10/2011  Findings: Areas of old infarction in the right occipital lobe, right frontal lobe and periventricular white matter extending into the right basal ganglia.  Old infarct in the left parietal lobe. Infarct within the right occipital lobe is new since prior CT and MRI, but appears chronic.  No acute infarction. No hemorrhage or hydrocephalus.  No midline shift.  No acute calvarial abnormality.  Mastoids are clear.  Visualized orbital soft tissues unremarkable.  IMPRESSION: Areas of old infarction bilaterally as described above.  No acute infarction visualized.   Original Report Authenticated By: Charlett Nose, M.D.     EKG:   Assessment/Plan Principal Problem:   Seizure disorder  - As discussed above, will give another 400mg  dose of dilantin- recheck level in am -resume dilantin 125 bid as previously since the subtherapeutic level is more likely secondary to non compliance(he ran out  as above) -monitor and consider neuro consult in am pending clinical course Active Problems:   Atrial flutter, new onset -as above in pt with recent CVA -His rate is controlled-range of 50-94 in the ED so will not add further rate control meds for now -We'll cycle cardiac enzymes, obtain TSH and follow. -Obtain 2-D echo in a.m. -Will start on therapeutic Lovenox, he has a CHADS2 score is 3, but given his #1/seizure disorder he is at risk for falls -recommend Cards/Neuro consultation in am for further recs and longterm anticoagulation   Recent Stroke - continue plavix for now   HTN (hypertension) - continue outpt meds, I have reduced his HCTZ to 25 mg. History of CKD -His creatinine today is 1.21, follow.  Code Status: full Family Communication:  Niece at bedside Disposition Plan: Admit to telemetry  Time spent: >85mins  Kela Millin [facility] Pager [phone]  If 7PM-7AM, please contact night-coverage [email or web address] Password [facility] 03/11/2012, 10:01 PM

## 2012-03-11 NOTE — ED Notes (Signed)
Pt clean, and dry prior to leaving department

## 2012-03-11 NOTE — ED Notes (Signed)
Per EMS, family found pt seizing on the toilet today. When EMS arrived pt post-ictal and on the floor.  Pt incontinent of urine and stool and N,V. Pt hypertensive. 190/120. CBG 122.

## 2012-03-11 NOTE — Progress Notes (Signed)
ANTICOAGULATION CONSULT NOTE - Initial Consult  Pharmacy Consult for Lovenox Indication: atrial fibrillation  No Known Allergies  Patient Measurements: Height: 5\' 5"  (165.1 cm) Weight: 183 lb 8 oz (83.235 kg) IBW/kg (Calculated) : 61.5  Vital Signs: Temp: 97.5 F (36.4 C) (03/01 2200) Temp src: Oral (03/01 1632) BP: 163/83 mmHg (03/01 2200) Pulse Rate: 74 (03/01 2200)  Labs:  Recent Labs  03/11/12 1709  HGB 15.6  HCT 46.0  CREATININE 1.20    Estimated Creatinine Clearance: 64.2 ml/min (by C-G formula based on Cr of 1.2).   Medical History: Past Medical History  Diagnosis Date  . 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 HTN (hypertension)   . Gout     big toe    Medications:  Prescriptions prior to admission  Medication Sig Dispense Refill  . amLODipine (NORVASC) 5 MG tablet Take 10 mg by mouth daily.      Marland Kitchen atorvastatin (LIPITOR) 20 MG tablet Take 2 tablets (40 mg total) by mouth daily.      . clopidogrel (PLAVIX) 75 MG tablet Take 1 tablet (75 mg total) by mouth daily with breakfast.      . colchicine 0.6 MG tablet Take 0.6 mg by mouth daily as needed.      . DULoxetine (CYMBALTA) 60 MG capsule Take 60 mg by mouth daily.      . hydrochlorothiazide (HYDRODIURIL) 25 MG tablet Take 50 mg by mouth daily.       Marland Kitchen labetalol (NORMODYNE) 200 MG tablet Take 200 mg by mouth 3 (three) times daily.      . nitroGLYCERIN (NITROSTAT) 0.4 MG SL tablet Place 0.4 mg under the tongue every 5 (five) minutes as needed. Chest Pain      . Tamsulosin HCl (FLOMAX) 0.4 MG CAPS Take 1 capsule (0.4 mg total) by mouth daily after supper.  30 capsule    . Vitamin D, Ergocalciferol, (DRISDOL) 50000 UNITS CAPS Take 50,000 Units by mouth every 7 (seven) days.       Scheduled:  . [START ON 03/12/2012] amLODipine  10 mg Oral Daily  . [START ON 03/12/2012] atorvastatin  40 mg Oral Daily  . [START ON 03/12/2012] clopidogrel  75 mg Oral Q breakfast  . [START ON 03/12/2012] colchicine  0.6 mg Oral Daily   . [START ON 03/12/2012] DULoxetine  60 mg Oral Daily  . enoxaparin (LOVENOX) injection  80 mg Subcutaneous NOW  . [START ON 03/12/2012] enoxaparin (LOVENOX) injection  80 mg Subcutaneous Q12H  . [START ON 03/12/2012] hydrochlorothiazide  25 mg Oral Daily  . labetalol  200 mg Oral TID  . phenytoin  125 mg Oral BID  . [COMPLETED] phenytoin  400 mg Oral Once  . phenytoin  400 mg Oral Once  . sodium chloride  3 mL Intravenous Q12H  . [START ON 03/12/2012] tamsulosin  0.4 mg Oral QPC supper    Assessment: 62yo male c/o Sz, had been out of Dilantin x3d, brought in by EMS who found pt was in Afib/flutter, noted h/o CVA, to begin Lovenox for new-onset of Afib with CHADS2 score of 3.  Goal of Therapy:  Anti-Xa level 0.6-1.2 units/ml 4hrs after LMWH dose given Monitor platelets by anticoagulation protocol: Yes   Plan:  Will begin Lovenox 80mg  SQ Q12H and monitor CBC and levels prn.  Colleen Can PharmD BCPS 03/11/2012,10:52 PM

## 2012-03-12 DIAGNOSIS — I6350 Cerebral infarction due to unspecified occlusion or stenosis of unspecified cerebral artery: Secondary | ICD-10-CM

## 2012-03-12 DIAGNOSIS — M109 Gout, unspecified: Secondary | ICD-10-CM

## 2012-03-12 LAB — BASIC METABOLIC PANEL
Calcium: 10 mg/dL (ref 8.4–10.5)
Creatinine, Ser: 1.52 mg/dL — ABNORMAL HIGH (ref 0.50–1.35)
GFR calc Af Amer: 55 mL/min — ABNORMAL LOW (ref >90)
GFR calc non Af Amer: 48 mL/min — ABNORMAL LOW (ref >90)

## 2012-03-12 LAB — CBC
HCT: 44.8 % (ref 39.0–52.0)
MCV: 92.9 fL (ref 78.0–100.0)
Platelets: 230 10*3/uL (ref 150–400)
RBC: 4.82 MIL/uL (ref 4.22–5.81)
WBC: 10.7 10*3/uL — ABNORMAL HIGH (ref 4.0–10.5)

## 2012-03-12 LAB — TSH: TSH: 0.523 u[IU]/mL (ref 0.350–4.500)

## 2012-03-12 MED ORDER — WARFARIN SODIUM 7.5 MG PO TABS
7.5000 mg | ORAL_TABLET | Freq: Once | ORAL | Status: AC
  Administered 2012-03-12: 7.5 mg via ORAL
  Filled 2012-03-12: qty 1

## 2012-03-12 MED ORDER — LEVETIRACETAM ER 500 MG PO TB24
1000.0000 mg | ORAL_TABLET | Freq: Every day | ORAL | Status: DC
  Administered 2012-03-13 – 2012-03-15 (×3): 1000 mg via ORAL
  Filled 2012-03-12 (×3): qty 2

## 2012-03-12 MED ORDER — OFF THE BEAT BOOK
Freq: Once | Status: DC
  Filled 2012-03-12: qty 1

## 2012-03-12 MED ORDER — SODIUM CHLORIDE 0.9 % IV SOLN
INTRAVENOUS | Status: DC
  Administered 2012-03-12 – 2012-03-14 (×4): via INTRAVENOUS

## 2012-03-12 MED ORDER — LEVETIRACETAM 500 MG/5ML IV SOLN
1000.0000 mg | Freq: Once | INTRAVENOUS | Status: AC
  Administered 2012-03-12: 1000 mg via INTRAVENOUS
  Filled 2012-03-12: qty 10

## 2012-03-12 MED ORDER — PATIENT'S GUIDE TO USING COUMADIN BOOK
Freq: Once | Status: AC
  Administered 2012-03-12: 18:00:00
  Filled 2012-03-12: qty 1

## 2012-03-12 MED ORDER — WARFARIN - PHARMACIST DOSING INPATIENT: Freq: Every day | Status: DC

## 2012-03-12 NOTE — Progress Notes (Signed)
Patient ID: Brent Acosta  male  ZOX:[id number]    DOB: 1950-06-06    DOA: 03/11/2012  PCP: Reynolds Bowl, MD  Assessment/Plan: Principal Problem:   Seizure disorder: Possibly secondary to noncompliance as he had run out of the Dilantin. Patient had no repeat seizures in the hospital. EKG at the time of adm showed new atrial fibrillation/flutter. Patient had a stroke in 11/13 possibly could have had an embolic stroke at that time. On telemetry currently patient is in normal sinus rhythm, he possibly could have paroxysmal atrial fibrillation -  Discussed in detail with neurology, Dr. Cyril Mourning and cardiology, Dr. Mayford Knife. In the event of new atrial fibrillation in recent stroke, hypertension, CHADS score is 3, warrants anticoagulation.  - Per neurology, place on Keppra. Dilantin will be discontinued.  New onset atrial fib/flutter vs paroxysmal: Patient was asymptomatic, , currently in NSR, CHADS 3 - D/w Dr Mayford Knife, recommended coumadin. Esp with his seizure and fall risk, newer agents in the event of fall or bleeding will be hard to reverse. Started on Lovenox bridge with coumadin.   - Outpatient followup with Dr. Mayford Knife in office for a PT/INR and Coumadin    Stroke - DC'ed plavix per neuro recommendations. Cont coumadin    HTN (hypertension)- stable  DVT Prophylaxis: on Lovenox and Coumadin  Code Status:  Disposition: PT eval     Subjective: Denies any specific complaints, no chest pain, shortness of breath, fevers or chills, nausea or vomiting   Objective: Weight change:   Intake/Output Summary (Last 24 hours) at 03/12/12 1326 Last data filed at 03/12/12 1300  Gross per 24 hour  Intake    240 ml  Output    100 ml  Net    140 ml   Blood pressure 133/87, pulse 68, temperature 98.3 F (36.8 C), temperature source Oral, resp. rate 20, height 5\' 5"  (1.651 m), weight 83.235 kg (183 lb 8 oz), SpO2 94.00%.  Physical Exam: General: Alert and awake, oriented x3, not in any acute  distress. CVS: S1-S2 clear, no murmur rubs or gallops Chest: clear to auscultation bilaterally, no wheezing, rales or rhonchi Abdomen: soft nontender, nondistended, normal bowel sounds, no organomegaly Extremities: no cyanosis, clubbing or edema noted bilaterally   Lab Results: Basic Metabolic Panel:  Recent Labs Lab 03/11/12 1709 03/12/12 0554  NA 141 141  K 3.6 3.8  CL 105 102  CO2  --  31  GLUCOSE 116* 106*  BUN 25* 28*  CREATININE 1.20 1.52*  CALCIUM  --  10.0   CBC:  Recent Labs Lab 03/11/12 1709 03/12/12 0554  WBC  --  10.7*  HGB 15.6 15.0  HCT 46.0 44.8  MCV  --  92.9  PLT  --  230   Cardiac Enzymes:  Recent Labs Lab 03/11/12 2351 03/12/12 0554 03/12/12 1014  TROPONINI <0.30 <0.30 <0.30   BNP: No components found with this basename: POCBNP,  CBG: No results found for this basename: GLUCAP,  in the last 168 hours   Micro Results: No results found for this or any previous visit (from the past 240 hour(s)).  Studies/Results: Ct Head Wo Contrast  03/11/2012  *RADIOLOGY REPORT*  Clinical Data: Headache.  Seizure.  CT HEAD WITHOUT CONTRAST  Technique:  Contiguous axial images were obtained from the base of the skull through the vertex without contrast.  Comparison: 12/10/2011  Findings: Areas of old infarction in the right occipital lobe, right frontal lobe and periventricular white matter extending into the right basal ganglia.  Old infarct in the left parietal lobe. Infarct within the right occipital lobe is new since prior CT and MRI, but appears chronic.  No acute infarction. No hemorrhage or hydrocephalus.  No midline shift.  No acute calvarial abnormality.  Mastoids are clear.  Visualized orbital soft tissues unremarkable.  IMPRESSION: Areas of old infarction bilaterally as described above.  No acute infarction visualized.   Original Report Authenticated By: Charlett Nose, M.D.     Medications: Scheduled Meds: . amLODipine  10 mg Oral Daily  .  atorvastatin  40 mg Oral Daily  . colchicine  0.6 mg Oral Daily  . DULoxetine  60 mg Oral Daily  . enoxaparin (LOVENOX) injection  80 mg Subcutaneous Q12H  . labetalol  200 mg Oral TID  . [START ON 03/13/2012] levETIRAcetam  1,000 mg Oral Daily  . off the beat book   Does not apply Once  . sodium chloride  3 mL Intravenous Q12H  . tamsulosin  0.4 mg Oral QPC supper  . warfarin  7.5 mg Oral ONCE-1800  . Warfarin - Pharmacist Dosing Inpatient   Does not apply q1800      LOS: 1 day   RAI,RIPUDEEP M.D. [facility] 03/12/2012, 1:26 PM Pager: [phone]  If 7PM-7AM, please contact night-coverage [email or web address] Password [email or web address]

## 2012-03-12 NOTE — Progress Notes (Signed)
At 0348 Pts hr dropped down into the 30's and he had a 4.04 sec pause. His rhythm then went into junctional and then SB-SR. Pt was asymptomatic. BP 119/80 with sats 95% on RA. Dr Donna Bernard made aware. No new orders. Will cont to monitor closely.

## 2012-03-12 NOTE — Consult Note (Signed)
NEURO HOSPITALIST CONSULT NOTE    Reason for Consult:seizures.  HPI:                                                                                                                                          Brent Acosta is an 62 y.o. male right handed male with a past medical history significant for hypertension, stroke, post stroke seizures, poor adherence to anti seizure treatment, and gout, brought to the hospital after sustaining a generalized tonic clonic seizure yesterday. Family found him seizing in the toilet and when EMS arrive he was described as " post ictal, incontinent of urine and stool".  He stated that he ran out his seizure pill ( dilantin) and did not take it for a couple of days.This had happened in the past Sub therapeutic dilantin level 3 and received oral loading dose 400 mg in the ED.  Mr. Lozinski stated that he started having seizures after his stroke last November, but assures me that his seizures are " infrequent" and not preceded by any warnings. Denies staring spells or episodes of unexplained confusion. No history of severe head injury, CNS infection, or epilepsy in his family. Apparently born full term, product of a normal pregnancy and delivery, no neonatal/perinatal complications. Of note, new onset atrial flutter.  Past Medical History  Diagnosis Date  . 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 HTN (hypertension)   . Gout     big toe    History reviewed. No pertinent past surgical history.  History reviewed. No pertinent family history.  Family History: no epilepsy.   Social History:  reports that he has been smoking Cigarettes.  He has been smoking about 0.50 packs per day. He does not have any smokeless tobacco history on file. He reports that he does not drink alcohol or use illicit drugs.  No Known Allergies  MEDICATIONS:                                                                                                                      I have reviewed the patient's current medications.   ROS:  History obtained from the patient and chart review.  General ROS: negative for - chills, fatigue, fever, night sweats, weight gain or weight loss Psychological ROS: negative for - behavioral disorder, hallucinations, memory difficulties, mood swings or suicidal ideation Ophthalmic ROS: negative for - blurry vision, double vision, eye pain or loss of vision ENT ROS: negative for - epistaxis, nasal discharge, oral lesions, sore throat, tinnitus or vertigo Allergy and Immunology ROS: negative for - hives or itchy/watery eyes Hematological and Lymphatic ROS: negative for - bleeding problems, bruising or swollen lymph nodes Endocrine ROS: negative for - galactorrhea, hair pattern changes, polydipsia/polyuria or temperature intolerance Respiratory ROS: negative for - cough, hemoptysis, shortness of breath or wheezing Cardiovascular ROS: negative for - chest pain, dyspnea on exertion, edema. Significant for irregular heartbeat Gastrointestinal ROS: negative for - abdominal pain, diarrhea, hematemesis, nausea/vomiting or stool incontinence Genito-Urinary ROS: negative for - dysuria, hematuria, incontinence or urinary frequency/urgency Musculoskeletal ROS: negative for - joint swelling or muscular weakness Neurological ROS: as noted in HPI Dermatological ROS: negative for rash and skin lesion changes   Physical exam: pleasant male in no apparent distress.Blood pressure 138/77, pulse 69, temperature 98 F (36.7 C), temperature source Oral, resp. rate 18, height 5\' 5"  (1.651 m), weight 83.235 kg (183 lb 8 oz), SpO2 93.00%. Head: normocephalic. Neck: supple, no bruits, no JVD. Cardiac: no murmurs. Lungs: clear. Abdomen: soft, no tender, no mass. Extremities: no edema.  Neurologic Examination:                                                                                                       Mental Status: Alert, awake, oriented x 4, thought content appropriate. Comprehension, naming, and repetition within normal limits. Speech fluent without evidence of aphasia.  Able to follow 3 step commands without difficulty. Cranial Nerves: II: Discs flat bilaterally; Visual fields grossly normal, pupils equal, round, reactive to light and accommodation III,IV, VI: ptosis not present, extra-ocular motions intact bilaterally V,VII: smile symmetric, facial light touch sensation normal bilaterally VIII: hearing normal bilaterally IX,X: gag reflex present XI: bilateral shoulder shrug XII: midline tongue extension Motor: Right : Upper extremity   5/5    Left:     Upper extremity   5/5  Lower extremity   5/5     Lower extremity   5/5 Tone and bulk:normal tone throughout; no atrophy noted Sensory: Pinprick and light touch intact throughout, bilaterally Deep Tendon Reflexes: 2+ and symmetric throughout Plantars: Right: upgoing   Left: upgoing Cerebellar: normal finger-to-nose,  normal heel-to-shin test Gait: no ataxia. CV: pulses palpable throughout    Lab Results  Component Value Date/Time   CHOL 208* 12/11/2011  6:37 AM    Results for orders placed during the hospital encounter of 03/11/12 (from the past 48 hour(s))  PHENYTOIN LEVEL, TOTAL     Status: Abnormal   Collection Time    03/11/12  4:53 PM      Result Value Range   Phenytoin Lvl 0.3 (*) 10.0 - 20.0 ug/mL  POCT I-STAT, CHEM 8     Status: Abnormal  Collection Time    03/11/12  5:09 PM      Result Value Range   Sodium 141  135 - 145 mEq/L   Potassium 3.6  3.5 - 5.1 mEq/L   Chloride 105  96 - 112 mEq/L   BUN 25 (*) 6 - 23 mg/dL   Creatinine, Ser 1.61  0.50 - 1.35 mg/dL   Glucose, Bld 096 (*) 70 - 99 mg/dL   Calcium, Ion 0.45  4.09 - 1.30 mmol/L   TCO2 27  0 - 100 mmol/L   Hemoglobin 15.6  13.0 - 17.0 g/dL   HCT 81.1  91.4 - 78.2  %  TROPONIN I     Status: None   Collection Time    03/11/12 11:51 PM      Result Value Range   Troponin I <0.30  <0.30 ng/mL   Comment:            Due to the release kinetics of cTnI,     a negative result within the first hours     of the onset of symptoms does not rule out     myocardial infarction with certainty.     If myocardial infarction is still suspected,     repeat the test at appropriate intervals.  PROTIME-INR     Status: None   Collection Time    03/11/12 11:51 PM      Result Value Range   Prothrombin Time 13.3  11.6 - 15.2 seconds   INR 1.02  0.00 - 1.49  BASIC METABOLIC PANEL     Status: Abnormal   Collection Time    03/12/12  5:54 AM      Result Value Range   Sodium 141  135 - 145 mEq/L   Potassium 3.8  3.5 - 5.1 mEq/L   Chloride 102  96 - 112 mEq/L   CO2 31  19 - 32 mEq/L   Glucose, Bld 106 (*) 70 - 99 mg/dL   BUN 28 (*) 6 - 23 mg/dL   Creatinine, Ser 9.56 (*) 0.50 - 1.35 mg/dL   Calcium 21.3  8.4 - 08.6 mg/dL   GFR calc non Af Amer 48 (*) >90 mL/min   GFR calc Af Amer 55 (*) >90 mL/min   Comment:            The eGFR has been calculated     using the CKD EPI equation.     This calculation has not been     validated in all clinical     situations.     eGFR's persistently     <90 mL/min signify     possible Chronic Kidney Disease.  TROPONIN I     Status: None   Collection Time    03/12/12  5:54 AM      Result Value Range   Troponin I <0.30  <0.30 ng/mL   Comment:            Due to the release kinetics of cTnI,     a negative result within the first hours     of the onset of symptoms does not rule out     myocardial infarction with certainty.     If myocardial infarction is still suspected,     repeat the test at appropriate intervals.  PHENYTOIN LEVEL, TOTAL     Status: Abnormal   Collection Time    03/12/12  5:54 AM      Result Value Range   Phenytoin Lvl  5.3 (*) 10.0 - 20.0 ug/mL  CBC     Status: Abnormal   Collection Time    03/12/12   5:54 AM      Result Value Range   WBC 10.7 (*) 4.0 - 10.5 K/uL   RBC 4.82  4.22 - 5.81 MIL/uL   Hemoglobin 15.0  13.0 - 17.0 g/dL   HCT 16.1  09.6 - 04.5 %   MCV 92.9  78.0 - 100.0 fL   MCH 31.1  26.0 - 34.0 pg   MCHC 33.5  30.0 - 36.0 g/dL   RDW 40.9  81.1 - 91.4 %   Platelets 230  150 - 400 K/uL    Ct Head Wo Contrast  03/11/2012  *RADIOLOGY REPORT*  Clinical Data: Headache.  Seizure.  CT HEAD WITHOUT CONTRAST  Technique:  Contiguous axial images were obtained from the base of the skull through the vertex without contrast.  Comparison: 12/10/2011  Findings: Areas of old infarction in the right occipital lobe, right frontal lobe and periventricular white matter extending into the right basal ganglia.  Old infarct in the left parietal lobe. Infarct within the right occipital lobe is new since prior CT and MRI, but appears chronic.  No acute infarction. No hemorrhage or hydrocephalus.  No midline shift.  No acute calvarial abnormality.  Mastoids are clear.  Visualized orbital soft tissues unremarkable.  IMPRESSION: Areas of old infarction bilaterally as described above.  No acute infarction visualized.   Original Report Authenticated By: Charlett Nose, M.D.      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 History of prior strokes, atrial flutter with CDHS2 score 3: will need anticoagulation for secondary stroke prevention, and thus dilantin will not be the ideal anticonvulsant due to the significant potential pharmacological interaction with coumadin. Thus, will suggest stopping dilantin, loading him with 1 gram IV keppra now and then maintaining him on keppra XR 1,000 mg once daily which should help with his compliance issues.  Wyatt Portela, MD   [facility] [phone]  03/12/2012, 9:53 AM

## 2012-03-12 NOTE — Progress Notes (Signed)
ANTICOAGULATION CONSULT NOTE - Initial Consult  Pharmacy Consult for Coumadin Indication: atrial fibrillation  No Known Allergies  Patient Measurements: Height: 5\' 5"  (165.1 cm) Weight: 183 lb 8 oz (83.235 kg) IBW/kg (Calculated) : 61.5  Vital Signs: Temp: 98 F (36.7 C) (03/02 0500) BP: 152/79 mmHg (03/02 1035) Pulse Rate: 63 (03/02 1037)  Labs:  Recent Labs  03/11/12 1709 03/11/12 2351 03/12/12 0554  HGB 15.6  --  15.0  HCT 46.0  --  44.8  PLT  --   --  230  LABPROT  --  13.3  --   INR  --  1.02  --   CREATININE 1.20  --  1.52*  TROPONINI  --  <0.30 <0.30    Estimated Creatinine Clearance: 50.7 ml/min (by C-G formula based on Cr of 1.52).   Medical History: Past Medical History  Diagnosis Date  . 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 HTN (hypertension)   . Gout     big toe    Medications:  Prescriptions prior to admission  Medication Sig Dispense Refill  . amLODipine (NORVASC) 5 MG tablet Take 10 mg by mouth daily.      Marland Kitchen atorvastatin (LIPITOR) 20 MG tablet Take 2 tablets (40 mg total) by mouth daily.      . clopidogrel (PLAVIX) 75 MG tablet Take 1 tablet (75 mg total) by mouth daily with breakfast.      . colchicine 0.6 MG tablet Take 0.6 mg by mouth daily as needed.      . DULoxetine (CYMBALTA) 60 MG capsule Take 60 mg by mouth daily.      . hydrochlorothiazide (HYDRODIURIL) 25 MG tablet Take 50 mg by mouth daily.       Marland Kitchen labetalol (NORMODYNE) 200 MG tablet Take 200 mg by mouth 3 (three) times daily.      . nitroGLYCERIN (NITROSTAT) 0.4 MG SL tablet Place 0.4 mg under the tongue every 5 (five) minutes as needed. Chest Pain      . Tamsulosin HCl (FLOMAX) 0.4 MG CAPS Take 1 capsule (0.4 mg total) by mouth daily after supper.  30 capsule    . Vitamin D, Ergocalciferol, (DRISDOL) 50000 UNITS CAPS Take 50,000 Units by mouth every 7 (seven) days.       Scheduled:  . amLODipine  10 mg Oral Daily  . atorvastatin  40 mg Oral Daily  . colchicine  0.6 mg Oral  Daily  . DULoxetine  60 mg Oral Daily  . [COMPLETED] enoxaparin (LOVENOX) injection  80 mg Subcutaneous NOW  . enoxaparin (LOVENOX) injection  80 mg Subcutaneous Q12H  . labetalol  200 mg Oral TID  . [START ON 03/13/2012] levETIRAcetam  1,000 mg Oral Daily  . levETIRAcetam  1,000 mg Intravenous Once  . off the beat book   Does not apply Once  . [COMPLETED] phenytoin  400 mg Oral Once  . [COMPLETED] phenytoin  400 mg Oral Once  . sodium chloride  3 mL Intravenous Q12H  . tamsulosin  0.4 mg Oral QPC supper  . [DISCONTINUED] clopidogrel  75 mg Oral Q breakfast  . [DISCONTINUED] hydrochlorothiazide  25 mg Oral Daily  . [DISCONTINUED] phenytoin  125 mg Oral BID    Assessment: 62yo male c/o Sz, had been out of Dilantin x3d, brought in by EMS who found pt was in Afib/flutter, noted h/o CVA, to begin Lovenox and coumadin for new-onset of Afib with CHADS2 score of 3.  Goal of Therapy:  INR  2-3 Anti-Xa level 0.6-1.2 units/ml 4hrs after LMWH dose given Monitor platelets by anticoagulation protocol: Yes   Plan:  Continue Lovenox 80mg  SQ Q12H until INR>2. Coumadin 7.5mg  po x 1 dose tonight. Daily PT/INR.  Wendie Simmer, PharmD, BCPS Clinical Pharmacist  Pager: [phone]

## 2012-03-13 LAB — BASIC METABOLIC PANEL
CO2: 26 mEq/L (ref 19–32)
Chloride: 106 mEq/L (ref 96–112)
Glucose, Bld: 100 mg/dL — ABNORMAL HIGH (ref 70–99)
Potassium: 3.5 mEq/L (ref 3.5–5.1)
Sodium: 140 mEq/L (ref 135–145)

## 2012-03-13 LAB — PROTIME-INR: INR: 1 (ref 0.00–1.49)

## 2012-03-13 MED ORDER — ENOXAPARIN SODIUM 120 MG/0.8ML ~~LOC~~ SOLN
120.0000 mg | SUBCUTANEOUS | Status: DC
  Administered 2012-03-14 – 2012-03-15 (×2): 120 mg via SUBCUTANEOUS
  Filled 2012-03-13 (×2): qty 0.8

## 2012-03-13 MED ORDER — WARFARIN SODIUM 7.5 MG PO TABS
7.5000 mg | ORAL_TABLET | Freq: Once | ORAL | Status: AC
  Administered 2012-03-13: 7.5 mg via ORAL
  Filled 2012-03-13: qty 1

## 2012-03-13 MED ORDER — ENOXAPARIN SODIUM 80 MG/0.8ML ~~LOC~~ SOLN
80.0000 mg | Freq: Two times a day (BID) | SUBCUTANEOUS | Status: AC
  Administered 2012-03-13: 80 mg via SUBCUTANEOUS
  Filled 2012-03-13: qty 0.8

## 2012-03-13 NOTE — Progress Notes (Signed)
  Echocardiogram 2D Echocardiogram has been performed.  LITTLE, CHRISTY FRANCES 03/13/2012, 7:13 PM

## 2012-03-13 NOTE — Care Management Note (Signed)
    Page 1 of 2   03/15/2012     11:23:54 AM   CARE MANAGEMENT NOTE 03/15/2012  Patient:  Brent Acosta, Brent Acosta   Account Number:  [id number]  Date Initiated:  03/13/2012  Documentation initiated by:  GRAVES-BIGELOW,BRENDA  Subjective/Objective Assessment:   Pt admitted with Seizures- new onset a fib. Pt is from [place] and now staying with sister here in [place].     Action/Plan:   Lovenox is covered via Medicaid. Pt will have to use a pharmacy here in [place]. Per pt's sister if pt had to go home on injections someone will be able to give the injection.   Anticipated DC Date:  03/14/2012   Anticipated DC Plan:  HOME W HOME HEALTH SERVICES      DC Planning Services  CM consult  Medication Assistance      [facility] Choice  HOME HEALTH   Choice offered to / List presented to:  C-5 Sibling        HH arranged  HH-1 RN  HH-10 DISEASE MANAGEMENT      HH agency  [facility]   Status of service:  Completed, signed off Medicare Important Message given?   (If response is "NO", the following Medicare IM given date fields will be blank) Date Medicare IM given:   Date Additional Medicare IM given:    Discharge Disposition:  HOME W HOME HEALTH SERVICES  Per UR Regulation:  Reviewed for med. necessity/level of care/duration of stay  If discussed at Long Length of Stay Meetings, dates discussed:    Comments:  03-15-12 1104 Tomi Bamberger, RN,BSN [phone] CM did call [facility] to make sure they can waive fee and they will be able to do so for medicaitons. CM will fax Rx to pharmacy. CM did make referral to [facility] for [facility] services. SOC to begin within 24-48 hours post d/c. Also provided pt with [facility] Number and a list of Physicians accepting new pts in [place]. CM did call [facility] and is awaiting an appointment time for Friday3-7-14. Appointments placed in the AVS.  No further needs from CM at this time.   03-13-12 Pt will need a wrtten Rx for all  medications. CM did call [facility] on [address] to see if they can waive the fee for a medicaid pt. [facility] will waive the fee if the pt has [place] Medicaid. Pt will benefit from [facility] for disease / medication managment. CM will continue to f/u for additional d/c needs.

## 2012-03-13 NOTE — Progress Notes (Addendum)
ANTICOAGULATION CONSULT NOTE - Initial Consult  Pharmacy Consult for Coumadin Indication: atrial fibrillation  No Known Allergies  Patient Measurements: Height: 5\' 5"  (165.1 cm) Weight: 183 lb 8 oz (83.235 kg) IBW/kg (Calculated) : 61.5  Vital Signs: Temp: 97.3 F (36.3 C) (03/03 0500) BP: 163/99 mmHg (03/03 0500) Pulse Rate: 59 (03/03 0500)  Labs:  Recent Labs  03/11/12 1709 03/11/12 2351 03/12/12 0554 03/12/12 1014 03/13/12 0625  HGB 15.6  --  15.0  --   --   HCT 46.0  --  44.8  --   --   PLT  --   --  230  --   --   LABPROT  --  13.3  --   --   --   INR  --  1.02  --   --   --   CREATININE 1.20  --  1.52*  --  1.16  TROPONINI  --  <0.30 <0.30 <0.30  --     Estimated Creatinine Clearance: 66.4 ml/min (by C-G formula based on Cr of 1.16).   Medical History: Past Medical History  Diagnosis Date  . 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 HTN (hypertension)   . Gout     big toe    Medications:  Prescriptions prior to admission  Medication Sig Dispense Refill  . amLODipine (NORVASC) 5 MG tablet Take 10 mg by mouth daily.      Marland Kitchen atorvastatin (LIPITOR) 20 MG tablet Take 2 tablets (40 mg total) by mouth daily.      . clopidogrel (PLAVIX) 75 MG tablet Take 1 tablet (75 mg total) by mouth daily with breakfast.      . colchicine 0.6 MG tablet Take 0.6 mg by mouth daily as needed.      . DULoxetine (CYMBALTA) 60 MG capsule Take 60 mg by mouth daily.      . hydrochlorothiazide (HYDRODIURIL) 25 MG tablet Take 50 mg by mouth daily.       Marland Kitchen labetalol (NORMODYNE) 200 MG tablet Take 200 mg by mouth 3 (three) times daily.      . nitroGLYCERIN (NITROSTAT) 0.4 MG SL tablet Place 0.4 mg under the tongue every 5 (five) minutes as needed. Chest Pain      . Tamsulosin HCl (FLOMAX) 0.4 MG CAPS Take 1 capsule (0.4 mg total) by mouth daily after supper.  30 capsule    . Vitamin D, Ergocalciferol, (DRISDOL) 50000 UNITS CAPS Take 50,000 Units by mouth every 7 (seven) days.        Scheduled:  . amLODipine  10 mg Oral Daily  . atorvastatin  40 mg Oral Daily  . colchicine  0.6 mg Oral Daily  . DULoxetine  60 mg Oral Daily  . enoxaparin (LOVENOX) injection  80 mg Subcutaneous Q12H  . labetalol  200 mg Oral TID  . levETIRAcetam  1,000 mg Oral Daily  . [COMPLETED] levETIRAcetam  1,000 mg Intravenous Once  . off the beat book   Does not apply Once  . [COMPLETED] patient's guide to using coumadin book   Does not apply Once  . sodium chloride  3 mL Intravenous Q12H  . tamsulosin  0.4 mg Oral QPC supper  . [COMPLETED] warfarin  7.5 mg Oral ONCE-1800  . Warfarin - Pharmacist Dosing Inpatient   Does not apply q1800  . [DISCONTINUED] clopidogrel  75 mg Oral Q breakfast  . [DISCONTINUED] hydrochlorothiazide  25 mg Oral Daily  . [DISCONTINUED] phenytoin  125 mg Oral  BID    Assessment: 62yo male c/o Sz, had been out of Dilantin x3d, brought in by EMS who found pt was in Afib/flutter, noted h/o CVA, to begin Lovenox and coumadin for new-onset of Afib with CHADS2 score of 3. Baseline INR 1.02, no new INR this morning, but probably still low after one dose of coumadin yesterday. Renal function improved, crcl 66 ml/min.   Goal of Therapy:  INR 2-3 Anti-Xa level 0.6-1.2 units/ml 4hrs after LMWH dose given Monitor platelets by anticoagulation protocol: Yes   Plan:  Continue Lovenox 80mg  SQ Q12H until INR>2. Coumadin 7.5mg  po x 1 tonight. Daily PT/INR.  Bayard Hugger, PharmD, BCPS  Clinical Pharmacist  Pager: [phone]   Addendum: Changed lovenox to 120mg  SQ Q 24hrs from tomorrow for convenience after discharge. Patient will still need 1dose of lovenox 80mg  sq tonight at 2200

## 2012-03-13 NOTE — Progress Notes (Signed)
Patient ID: Brent Acosta  male  ZOX:[id number]    DOB: 1950/07/04    DOA: 03/11/2012  PCP: Reynolds Bowl, MD  Assessment/Plan: Principal Problem:   Seizure disorder: no repeat seizures in the hospital. EKG at the time of adm showed new atrial fibrillation/flutter. Patient had a stroke in 11/13 possibly could have had an embolic stroke at that time.  -  Per neurology, placed on Keppra. Dilantin will be discontinued.  New onset atrial fib/flutter vs paroxysmal: Patient was asymptomatic,now in NSR, CHADS 3 - D/w Dr Mayford Knife, recommended coumadin. Esp with his seizure and fall risk, newer agents in the event of fall or bleeding will be hard to reverse. Started on Lovenox bridge with coumadin.   - Outpatient followup with Dr. Mayford Knife in office for a PT/INR and Coumadin - lovenox teaching, patient lives with family     Stroke - DC'ed plavix per neuro recommendations. Cont coumadin    HTN (hypertension)- stable  DVT Prophylaxis: on Lovenox and Coumadin  Code Status:  Disposition: PT eval pending    Subjective:  no chest pain, shortness of breath, no repeat seizures, eating breakfast  Objective: Weight change:   Intake/Output Summary (Last 24 hours) at 03/13/12 1201 Last data filed at 03/13/12 0600  Gross per 24 hour  Intake   1500 ml  Output    700 ml  Net    800 ml   Blood pressure 124/86, pulse 68, temperature 97.3 F (36.3 C), temperature source Oral, resp. rate 18, height 5\' 5"  (1.651 m), weight 83.235 kg (183 lb 8 oz), SpO2 96.00%.  Physical Exam: General: Alert and awake, oriented x3, NAD. CVS: S1-S2 clear, no murmur rubs or gallops Chest: CTAB Abdomen: soft NT, ND, NBS Extremities: no c/c/e bilaterally   Lab Results: Basic Metabolic Panel:  Recent Labs Lab 03/12/12 0554 03/13/12 0625  NA 141 140  K 3.8 3.5  CL 102 106  CO2 31 26  GLUCOSE 106* 100*  BUN 28* 25*  CREATININE 1.52* 1.16  CALCIUM 10.0 9.0   CBC:  Recent Labs Lab 03/11/12 1709  03/12/12 0554  WBC  --  10.7*  HGB 15.6 15.0  HCT 46.0 44.8  MCV  --  92.9  PLT  --  230   Cardiac Enzymes:  Recent Labs Lab 03/11/12 2351 03/12/12 0554 03/12/12 1014  TROPONINI <0.30 <0.30 <0.30   BNP: No components found with this basename: POCBNP,  CBG: No results found for this basename: GLUCAP,  in the last 168 hours   Micro Results: No results found for this or any previous visit (from the past 240 hour(s)).  Studies/Results: Ct Head Wo Contrast  03/11/2012  *RADIOLOGY REPORT*  Clinical Data: Headache.  Seizure.  CT HEAD WITHOUT CONTRAST  Technique:  Contiguous axial images were obtained from the base of the skull through the vertex without contrast.  Comparison: 12/10/2011  Findings: Areas of old infarction in the right occipital lobe, right frontal lobe and periventricular white matter extending into the right basal ganglia.  Old infarct in the left parietal lobe. Infarct within the right occipital lobe is new since prior CT and MRI, but appears chronic.  No acute infarction. No hemorrhage or hydrocephalus.  No midline shift.  No acute calvarial abnormality.  Mastoids are clear.  Visualized orbital soft tissues unremarkable.  IMPRESSION: Areas of old infarction bilaterally as described above.  No acute infarction visualized.   Original Report Authenticated By: Charlett Nose, M.D.     Medications: Scheduled Meds: . amLODipine  10 mg Oral Daily  . atorvastatin  40 mg Oral Daily  . colchicine  0.6 mg Oral Daily  . DULoxetine  60 mg Oral Daily  . enoxaparin (LOVENOX) injection  80 mg Subcutaneous Q12H  . labetalol  200 mg Oral TID  . levETIRAcetam  1,000 mg Oral Daily  . off the beat book   Does not apply Once  . sodium chloride  3 mL Intravenous Q12H  . tamsulosin  0.4 mg Oral QPC supper  . warfarin  7.5 mg Oral ONCE-1800  . Warfarin - Pharmacist Dosing Inpatient   Does not apply q1800      LOS: 2 days   RAI,RIPUDEEP M.D. [facility] 03/13/2012,  12:01 PM Pager: [phone]  If 7PM-7AM, please contact night-coverage [email or web address] Password [email or web address]

## 2012-03-14 LAB — PROTIME-INR
INR: 1.14 (ref 0.00–1.49)
Prothrombin Time: 14.4 seconds (ref 11.6–15.2)

## 2012-03-14 LAB — BASIC METABOLIC PANEL
CO2: 25 mEq/L (ref 19–32)
Chloride: 105 mEq/L (ref 96–112)
GFR calc Af Amer: 86 mL/min — ABNORMAL LOW (ref >90)
Potassium: 3.9 mEq/L (ref 3.5–5.1)
Sodium: 139 mEq/L (ref 135–145)

## 2012-03-14 MED ORDER — WARFARIN SODIUM 10 MG PO TABS
10.0000 mg | ORAL_TABLET | Freq: Once | ORAL | Status: AC
  Administered 2012-03-14: 10 mg via ORAL
  Filled 2012-03-14: qty 1

## 2012-03-14 MED ORDER — HALOPERIDOL LACTATE 5 MG/ML IJ SOLN
5.0000 mg | Freq: Four times a day (QID) | INTRAMUSCULAR | Status: DC | PRN
  Administered 2012-03-14: 5 mg via INTRAMUSCULAR
  Filled 2012-03-14: qty 1

## 2012-03-14 MED ORDER — HYDROCHLOROTHIAZIDE 25 MG PO TABS
25.0000 mg | ORAL_TABLET | Freq: Every day | ORAL | Status: DC
  Administered 2012-03-14 – 2012-03-15 (×2): 25 mg via ORAL
  Filled 2012-03-14 (×2): qty 1

## 2012-03-14 NOTE — Progress Notes (Signed)
ANTICOAGULATION CONSULT NOTE - Initial Consult  Pharmacy Consult for Coumadin Indication: atrial fibrillation  No Known Allergies  Patient Measurements: Height: 5\' 5"  (165.1 cm) Weight:  (Patient refused to get in bed.) IBW/kg (Calculated) : 61.5  Vital Signs: Temp: 97.5 F (36.4 C) (03/04 0714) Temp src: Oral (03/04 0714) BP: 184/112 mmHg (03/04 0714) Pulse Rate: 75 (03/04 0714)  Labs:  Recent Labs  03/11/12 1709 03/11/12 2351 03/12/12 0554 03/12/12 1014 03/13/12 0625 03/13/12 1322 03/14/12 0520  HGB 15.6  --  15.0  --   --   --   --   HCT 46.0  --  44.8  --   --   --   --   PLT  --   --  230  --   --   --   --   LABPROT  --  13.3  --   --   --  13.1 14.4  INR  --  1.02  --   --   --  1.00 1.14  CREATININE 1.20  --  1.52*  --  1.16  --  1.06  TROPONINI  --  <0.30 <0.30 <0.30  --   --   --     Estimated Creatinine Clearance: 72.7 ml/min (by C-G formula based on Cr of 1.06).   Medical History: Past Medical History  Diagnosis Date  . 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 HTN (hypertension)   . Gout     big toe    Medications:  Prescriptions prior to admission  Medication Sig Dispense Refill  . amLODipine (NORVASC) 5 MG tablet Take 10 mg by mouth daily.      Marland Kitchen atorvastatin (LIPITOR) 20 MG tablet Take 2 tablets (40 mg total) by mouth daily.      . clopidogrel (PLAVIX) 75 MG tablet Take 1 tablet (75 mg total) by mouth daily with breakfast.      . colchicine 0.6 MG tablet Take 0.6 mg by mouth daily as needed.      . DULoxetine (CYMBALTA) 60 MG capsule Take 60 mg by mouth daily.      . hydrochlorothiazide (HYDRODIURIL) 25 MG tablet Take 50 mg by mouth daily.       Marland Kitchen labetalol (NORMODYNE) 200 MG tablet Take 200 mg by mouth 3 (three) times daily.      . nitroGLYCERIN (NITROSTAT) 0.4 MG SL tablet Place 0.4 mg under the tongue every 5 (five) minutes as needed. Chest Pain      . Tamsulosin HCl (FLOMAX) 0.4 MG CAPS Take 1 capsule (0.4 mg total) by mouth daily after  supper.  30 capsule    . Vitamin D, Ergocalciferol, (DRISDOL) 50000 UNITS CAPS Take 50,000 Units by mouth every 7 (seven) days.       Scheduled:  . amLODipine  10 mg Oral Daily  . atorvastatin  40 mg Oral Daily  . colchicine  0.6 mg Oral Daily  . DULoxetine  60 mg Oral Daily  . enoxaparin (LOVENOX) injection  120 mg Subcutaneous Q24H  . [COMPLETED] enoxaparin (LOVENOX) injection  80 mg Subcutaneous Q12H  . labetalol  200 mg Oral TID  . levETIRAcetam  1,000 mg Oral Daily  . off the beat book   Does not apply Once  . sodium chloride  3 mL Intravenous Q12H  . tamsulosin  0.4 mg Oral QPC supper  . [COMPLETED] warfarin  7.5 mg Oral ONCE-1800  . Warfarin - Pharmacist Dosing Inpatient   Does not  apply q1800  . [DISCONTINUED] enoxaparin (LOVENOX) injection  80 mg Subcutaneous Q12H    Assessment: 62yo male c/o Sz, had been out of Dilantin x3d, brought in by EMS who found pt was in Afib/flutter, noted h/o CVA, on Lovenox and coumadin for new-onset of Afib with CHADS2 score of 3. Baseline INR 1.14 after 2 doses of coumadin 7.5mg ,. Renal function improved, crcl 72 ml/min.   Note: patient on HCTZ 50mg  daily PTA, currently on hold, his BP is elevated today 184/112.  Goal of Therapy:  INR 2-3 Anti-Xa level 0.6-1.2 units/ml 4hrs after LMWH dose given Monitor platelets by anticoagulation protocol: Yes   Plan:  Continue Lovenox 120 mg SQ Q24 hrs until INR>2. Coumadin 10 mg po x 1 tonight. Daily PT/INR. Please consider resume HCTZ  Bayard Hugger, PharmD, BCPS  Clinical Pharmacist  Pager: [phone]

## 2012-03-14 NOTE — Progress Notes (Signed)
At approximately 04:15, patient got out of bed unassisted. This RN responded to bed alarm and found patient attempting to climb around chair into far corner of room.  He had pulled his IV line out. Patient was unable to tell RN what he was doing, where he was or what was going on. He became combative when I attempted to assist him to toilet, cursing at me and trying to shove me out of his way while attempting to get out of the corner.  He then tried to urinate in the closet, soiling his gown and underwear.  I was able to safely escort him to the bathroom. He then removed all of his telemetry leads and continued to curse. BP and HR were checked with the patient sitting in the bathroom and were normal for him.  Prior to removing leads, patient was in normal sinus rhythm on telemetry.  Contacted Tama Gander, NP on-call for [employer] and discussed findings. New orders received for IM haldol.  After toileting, patient more compliant. Assisted back to bed and replaced telemetry leads.  Continuing to closely monitor.

## 2012-03-14 NOTE — Progress Notes (Addendum)
Patient ID: Brent Acosta  male  WUJ:[id number]    DOB: [birth year]    DOA: 03/11/2012  PCP: Reynolds Bowl, MD  Assessment/Plan: Principal Problem:   Seizure disorder: no repeat seizures in the hospital. EKG at the time of adm showed new atrial fibrillation/flutter. Patient had a stroke in 11/13 possibly could have had an embolic stroke at that time.  -  Per neurology, placed on Keppra. Dilantin discontinued.  New onset atrial fib/flutter vs paroxysmal: Patient was asymptomatic,now in NSR, CHADS 3 - D/w Dr Mayford Knife, recommended coumadin. Esp with his seizure and fall risk, newer agents in the event of fall or bleeding will be hard to reverse. Started on Lovenox bridge with coumadin.  INR 1.14 - Outpatient followup with Dr. Mayford Knife in office for a PT/INR and Coumadin - lovenox teaching, patient lives with family  - 2-D echo done, EF 60-65%, no regional wall motion abnormalities, grade 2 diastolic dysfunction    Stroke - DC'ed plavix per neuro recommendations. Cont coumadin    HTN (hypertension)- elevated - Added HCTZ, continue labetalol and Norvasc  Altered mental status; overnight events noted patient became combative last night needing IM Haldol ? Sundowning  - Currently improved, alert and awake, we'll continue to monitor today  DVT Prophylaxis: on Lovenox and Coumadin  Code Status:  Disposition: PT eval pending    Subjective: Overnight events noted, currently stable, watching TV denies any specific complaints of any chest pain, shortness of breath, fevers chills  Objective: Weight change:   Intake/Output Summary (Last 24 hours) at 03/14/12 1311 Last data filed at 03/14/12 0415  Gross per 24 hour  Intake 693.75 ml  Output    250 ml  Net 443.75 ml   Blood pressure 184/112, pulse 75, temperature 97.5 F (36.4 C), temperature source Oral, resp. rate 18, height 5\' 5"  (1.651 m), weight 83.235 kg (183 lb 8 oz), SpO2 100.00%.  Physical Exam: General: Alert and awake, NAD. CVS:  S1-S2 clear, no murmur rubs or gallops Chest: CTAB Abdomen: soft NT, ND, NBS Extremities: no c/c/e bilaterally   Lab Results: Basic Metabolic Panel:  Recent Labs Lab 03/13/12 0625 03/14/12 0520  NA 140 139  K 3.5 3.9  CL 106 105  CO2 26 25  GLUCOSE 100* 114*  BUN 25* 17  CREATININE 1.16 1.06  CALCIUM 9.0 9.0   CBC:  Recent Labs Lab 03/11/12 1709 03/12/12 0554  WBC  --  10.7*  HGB 15.6 15.0  HCT 46.0 44.8  MCV  --  92.9  PLT  --  230   Cardiac Enzymes:  Recent Labs Lab 03/11/12 2351 03/12/12 0554 03/12/12 1014  TROPONINI <0.30 <0.30 <0.30   BNP: No components found with this basename: POCBNP,  CBG: No results found for this basename: GLUCAP,  in the last 168 hours   Micro Results: No results found for this or any previous visit (from the past 240 hour(s)).  Studies/Results: Ct Head Wo Contrast  03/11/2012  *RADIOLOGY REPORT*  Clinical Data: Headache.  Seizure.  CT HEAD WITHOUT CONTRAST  Technique:  Contiguous axial images were obtained from the base of the skull through the vertex without contrast.  Comparison: 12/10/2011  Findings: Areas of old infarction in the right occipital lobe, right frontal lobe and periventricular white matter extending into the right basal ganglia.  Old infarct in the left parietal lobe. Infarct within the right occipital lobe is new since prior CT and MRI, but appears chronic.  No acute infarction. No hemorrhage or hydrocephalus.  No midline shift.  No acute calvarial abnormality.  Mastoids are clear.  Visualized orbital soft tissues unremarkable.  IMPRESSION: Areas of old infarction bilaterally as described above.  No acute infarction visualized.   Original Report Authenticated By: Charlett Nose, M.D.     Medications: Scheduled Meds: . amLODipine  10 mg Oral Daily  . atorvastatin  40 mg Oral Daily  . colchicine  0.6 mg Oral Daily  . DULoxetine  60 mg Oral Daily  . enoxaparin (LOVENOX) injection  120 mg Subcutaneous Q24H  .  labetalol  200 mg Oral TID  . levETIRAcetam  1,000 mg Oral Daily  . off the beat book   Does not apply Once  . sodium chloride  3 mL Intravenous Q12H  . tamsulosin  0.4 mg Oral QPC supper  . warfarin  10 mg Oral ONCE-1800  . Warfarin - Pharmacist Dosing Inpatient   Does not apply q1800      LOS: 3 days   RAI,RIPUDEEP M.D. [facility] 03/14/2012, 1:11 PM Pager: [phone]  If 7PM-7AM, please contact night-coverage [email or web address] Password [email or web address]

## 2012-03-14 NOTE — Evaluation (Signed)
Physical Therapy Evaluation Patient Details Name: Brent Acosta MRN: [id number] DOB: 1950/03/04 Today's Date: 03/14/2012 Time: 4098-1191 PT Time Calculation (min): 25 min  PT Assessment / Plan / Recommendation Clinical Impression  Pt admitted after witnessed seizure. Pt is likely approaching his baseline function.  I'm a little concerned about possible chronic or acute visual changes that he is not compensating for fully.  He should be fine at his niece's home, but may need HHPT home safety and treat to address safety issues.    PT Assessment  Patient needs continued PT services    Follow Up Recommendations  Home health PT;Supervision - Intermittent;Supervision for mobility/OOB    Does the patient have the potential to tolerate intense rehabilitation      Barriers to Discharge        Equipment Recommendations  None recommended by PT    Recommendations for Other Services     Frequency Min 3X/week    Precautions / Restrictions Precautions Precautions: Fall (minimal risk, but appears to not see some obstacles)   Pertinent Vitals/Pain      Mobility  Bed Mobility Bed Mobility: Supine to Sit;Sitting - Scoot to Edge of Bed;Sit to Supine Supine to Sit: 7: Independent Sitting - Scoot to Edge of Bed: 7: Independent Sit to Supine: 7: Independent Transfers Transfers: Sit to Stand;Stand to Sit Sit to Stand: 5: Supervision;With upper extremity assist;From bed Stand to Sit: 5: Supervision;With upper extremity assist;To bed Details for Transfer Assistance: safe mobility Ambulation/Gait Ambulation/Gait Assistance: 5: Supervision Ambulation Distance (Feet): 400 Feet Assistive device: None Ambulation/Gait Assistance Details: mildly halted gait, though not really HP in nature.  Slow of cadence with little change if forced.  Pt does not scan his environment unless cued to do so and gets more guarded when he does.  Ran into obstacles on the L times 2 and tended to hug the Right side in  general. Gait Pattern: Step-through pattern;Decreased step length - right;Decreased step length - left;Decreased stride length Gait velocity: slower    Exercises     PT Diagnosis:  (decreased higher level balance)  PT Problem List: Decreased safety awareness;Decreased strength;Decreased activity tolerance PT Treatment Interventions: Gait training;Stair training;Functional mobility training;Therapeutic activities;Balance training;Patient/family education   PT Goals Acute Rehab PT Goals PT Goal Formulation: With patient Time For Goal Achievement: 03/21/12 Potential to Achieve Goals: Good Pt will Ambulate: >150 feet;Independently PT Goal: Ambulate - Progress: Goal set today Pt will Go Up / Down Stairs: 3-5 stairs;with modified independence;with rail(s) PT Goal: Up/Down Stairs - Progress: Goal set today Additional Goals Additional Goal #1: DGI >=21 PT Goal: Additional Goal #1 - Progress: Goal set today  Visit Information  Last PT Received On: 03/14/12 Assistance Needed: +1    Subjective Data  Subjective: yeah I drive and I don't use a walker or cane Patient Stated Goal: Eventually I'd like to get back to my house.Marland KitchenMarland KitchenI have a room mate   Prior Functioning  Home Living Lives With: Other (Comment) (roommate usually, but is living with his neice presently) Available Help at Discharge: Family;Other (Comment) (neice and sister) Type of Home: House Home Access: Stairs to enter [employer] of Steps: several Entrance Stairs-Rails: Right;Left Home Layout: One level Bathroom Shower/Tub: Forensic scientist: Standard Home Adaptive Equipment: None Prior Function Level of Independence: Independent Able to Take Stairs?: Yes Driving: Yes (but not lately) Communication Communication: No difficulties    Cognition  Cognition Overall Cognitive Status: Difficult to assess Difficult to assess due to: Other (comment) (hard  to parse out what is true and what pt  wishes to be true) Arousal/Alertness: Awake/alert Orientation Level: Appears intact for tasks assessed Behavior During Session: [facility] for tasks performed    Extremity/Trunk Assessment Right Upper Extremity Assessment RUE ROM/Strength/Tone: [facility] for tasks assessed Left Upper Extremity Assessment LUE ROM/Strength/Tone: [facility] for tasks assessed (mildly weak bil with triceps) Right Lower Extremity Assessment RLE ROM/Strength/Tone: [facility] for tasks assessed;Deficits RLE ROM/Strength/Tone Deficits: generally functional but mildly weak, left weaker than R Left Lower Extremity Assessment LLE ROM/Strength/Tone: WFL for tasks assessed;Deficits LLE ROM/Strength/Tone Deficits: see R LE Trunk Assessment Trunk Assessment:  (mildly weak when stabilizing against moderate to maxi. force)   Balance Balance Balance Assessed: Yes High Level Balance High Level Balance Activites: Side stepping;Backward walking;Direction changes;Turns;Sudden stops;Head turns High Level Balance Comments: tentative during these higher level activities, but no LOB  End of Session PT - End of Session Activity Tolerance: Patient tolerated treatment well Patient left: in bed;with call bell/phone within reach Nurse Communication: Mobility status  GP     Mottinger, Eliseo Gum 03/14/2012, 5:04 PM 03/14/2012  [place], [place] [phone] (pager)

## 2012-03-15 LAB — CBC
Hemoglobin: 14.4 g/dL (ref 13.0–17.0)
Platelets: 230 10*3/uL (ref 150–400)
RBC: 4.64 MIL/uL (ref 4.22–5.81)

## 2012-03-15 LAB — BASIC METABOLIC PANEL
CO2: 25 mEq/L (ref 19–32)
GFR calc non Af Amer: 55 mL/min — ABNORMAL LOW (ref >90)
Glucose, Bld: 91 mg/dL (ref 70–99)
Potassium: 3.8 mEq/L (ref 3.5–5.1)
Sodium: 143 mEq/L (ref 135–145)

## 2012-03-15 LAB — PROTIME-INR
INR: 1.72 — ABNORMAL HIGH (ref 0.00–1.49)
Prothrombin Time: 19.6 seconds — ABNORMAL HIGH (ref 11.6–15.2)

## 2012-03-15 MED ORDER — WARFARIN SODIUM 7.5 MG PO TABS: 7.5000 mg | ORAL_TABLET | Freq: Every day | ORAL | Status: AC

## 2012-03-15 MED ORDER — HYDROCHLOROTHIAZIDE 25 MG PO TABS: 25.0000 mg | ORAL_TABLET | Freq: Every day | ORAL | Status: AC

## 2012-03-15 MED ORDER — ENOXAPARIN SODIUM 120 MG/0.8ML ~~LOC~~ SOLN: 120.0000 mg | SUBCUTANEOUS | Status: AC

## 2012-03-15 MED ORDER — LEVETIRACETAM ER 500 MG PO TB24: 1000.0000 mg | ORAL_TABLET | Freq: Every day | ORAL | Status: AC

## 2012-03-15 NOTE — Discharge Summary (Signed)
Physician Discharge Summary  Brent Acosta ZOX:[id number] DOB: [birth year] DOA: 03/11/2012  PCP: Reynolds Bowl, MD  Admit date: 03/11/2012 Discharge date: 03/15/2012  Time spent: 35 minutes  Recommendations for Outpatient Follow-up:  1. Patient will need an INR level checked in about 3-4 days at cardiology office 2. Patient will need outpatient physical therapy for rehabilitation purposes 3. Should follow up with primary care physician in 3-7 days  4.  recommend up titration of blood pressure medicines, could add ACE inhibitor in 3-5 days, recommend be met prior to starting the same.  Discharge Diagnoses:  Principal Problem:   Seizure disorder Active Problems:   Stroke   HTN (hypertension)   Atrial flutter   Discharge Condition: stable   Diet recommendation:  healthy low-salt   Filed Weights   03/11/12 2200  Weight: 83.235 kg (183 lb 8 oz)    History of present illness:  62 year old male admitted 03/11/2012 with alleged seizure. Patient is noncompliant medications because he ran out. He was loaded with Dilantin in the emergency room 40 mg at an EKG showed atrial flutter and he was and 7 flutter and was subsequently admitted. Patient has a recent history of CVA November 2013 and a history of seizures and had recent moved from [place] to [place] where he lives with his niece. Patient had no other recent acute medical issues or concerns  Hospital Course:  Seizure disorder: no repeat seizures in the hospital. EKG at the time of adm showed new atrial fibrillation/flutter. Patient had a stroke in 11/13 possibly could have had an embolic stroke at that time.  - Per neurology, placed on Keppra 1000 mg daily. Dilantin discontinue secondary to interactions with warfarin   New onset atrial fib/flutter vs paroxysmal: Patient was asymptomatic,now in NSR, CHADS 3  - D/w Dr Mayford Knife, recommended coumadin. Esp with his seizure and fall risk, newer agents in the event of fall or bleeding will be  hard to reverse. Started on Lovenox bridge with coumadin.  - Outpatient followup with Dr. Mayford Knife in office for a PT/INR and Coumadin-INR followup on day of discharge 1.72 - lovenox teaching, patient lives with family  - 2-D echo done, EF 60-65%, no regional wall motion abnormalities, grade 2 diastolic dysfunction   Stroke  - DC'ed plavix per neuro recommendations. Cont coumadin-patient discharged home on Lovenox bridge.  HTN (hypertension)- elevated  - Added HCTZ, continue labetalol and Norvasc   Altered mental status; overnight events noted patient became combative last night needing IM Haldol ? Sundowning  - Currently improved, alert and awake, we'll continue to monitor today   Procedures:   CT of head 3 /01/14 = areas of old infarction bilaterally, no acute infarction   Consultations:   neurology  Telephone consulted cardiology during this admission   Discharge Exam: Filed Vitals:   03/14/12 1430 03/14/12 1700 03/14/12 2100 03/15/12 0500  BP: 177/100 155/83 170/84 174/97  Pulse: 76 66 69 67  Temp: 97.7 F (36.5 C)  98.5 F (36.9 C) 98.2 F (36.8 C)  TempSrc: Oral     Resp: 20 18 18 18   Height:      Weight:      SpO2: 98%  97% 98%    alert pleasant oriented-seems may be a little confused  General: Clinically clear mucosa moist no pallor no Cardiovascular:  S1-S2 no murmur rub or gallop  Respiratory:  clinically clear   Discharge Instructions  Discharge Orders   Future Orders Complete By Expires     Call MD  for:  difficulty breathing, headache or visual disturbances  As directed     Call MD for:  persistant dizziness or light-headedness  As directed     Call MD for:  persistant nausea and vomiting  As directed     Call MD for:  severe uncontrolled pain  As directed     Call MD for:  temperature >100.4  As directed     Diet - low sodium heart healthy  As directed     Increase activity slowly  As directed         Medication List    STOP taking these  medications       clopidogrel 75 MG tablet  Commonly known as:  PLAVIX     phenytoin 100 MG/4ML suspension  Commonly known as:  DILANTIN      TAKE these medications       amLODipine 5 MG tablet  Commonly known as:  NORVASC  Take 10 mg by mouth daily.     atorvastatin 20 MG tablet  Commonly known as:  LIPITOR  Take 2 tablets (40 mg total) by mouth daily.     colchicine 0.6 MG tablet  Take 0.6 mg by mouth daily as needed.     DULoxetine 60 MG capsule  Commonly known as:  CYMBALTA  Take 60 mg by mouth daily.     enoxaparin 120 MG/0.8ML injection  Commonly known as:  LOVENOX  Inject 0.8 mLs (120 mg total) into the skin daily.     hydrochlorothiazide 25 MG tablet  Commonly known as:  HYDRODIURIL  Take 1 tablet (25 mg total) by mouth daily.     labetalol 200 MG tablet  Commonly known as:  NORMODYNE  Take 200 mg by mouth 3 (three) times daily.     levETIRAcetam 500 MG 24 hr tablet  Commonly known as:  KEPPRA XR  Take 2 tablets (1,000 mg total) by mouth daily.     nitroGLYCERIN 0.4 MG SL tablet  Commonly known as:  NITROSTAT  Place 0.4 mg under the tongue every 5 (five) minutes as needed. Chest Pain     tamsulosin 0.4 MG Caps  Commonly known as:  FLOMAX  Take 1 capsule (0.4 mg total) by mouth daily after supper.     Vitamin D (Ergocalciferol) 50000 UNITS Caps  Commonly known as:  DRISDOL  Take 50,000 Units by mouth every 7 (seven) days.     warfarin 7.5 MG tablet  Commonly known as:  COUMADIN  Take 1 tablet (7.5 mg total) by mouth daily.           Follow-up Information   Call Reynolds Bowl, MD.   Contact information:   [address] [place] [address] [phone]        The results of significant diagnostics from this hospitalization (including imaging, microbiology, ancillary and laboratory) are listed below for reference.    Significant Diagnostic Studies: Ct Head Wo Contrast  03/11/2012  *RADIOLOGY REPORT*  Clinical Data: Headache.   Seizure.  CT HEAD WITHOUT CONTRAST  Technique:  Contiguous axial images were obtained from the base of the skull through the vertex without contrast.  Comparison: 12/10/2011  Findings: Areas of old infarction in the right occipital lobe, right frontal lobe and periventricular white matter extending into the right basal ganglia.  Old infarct in the left parietal lobe. Infarct within the right occipital lobe is new since prior CT and MRI, but appears chronic.  No acute infarction. No hemorrhage  or hydrocephalus.  No midline shift.  No acute calvarial abnormality.  Mastoids are clear.  Visualized orbital soft tissues unremarkable.  IMPRESSION: Areas of old infarction bilaterally as described above.  No acute infarction visualized.   Original Report Authenticated By: Charlett Nose, M.D.     Microbiology: No results found for this or any previous visit (from the past 240 hour(s)).   Labs: Basic Metabolic Panel:  Recent Labs Lab 03/11/12 1709 03/12/12 0554 03/13/12 0625 03/14/12 0520 03/15/12 0600  NA 141 141 140 139 143  K 3.6 3.8 3.5 3.9 3.8  CL 105 102 106 105 109  CO2  --  31 26 25 25   GLUCOSE 116* 106* 100* 114* 91  BUN 25* 28* 25* 17 22  CREATININE 1.20 1.52* 1.16 1.06 1.35  CALCIUM  --  10.0 9.0 9.0 9.5   Liver Function Tests: No results found for this basename: AST, ALT, ALKPHOS, BILITOT, PROT, ALBUMIN,  in the last 168 hours No results found for this basename: LIPASE, AMYLASE,  in the last 168 hours No results found for this basename: AMMONIA,  in the last 168 hours CBC:  Recent Labs Lab 03/11/12 1709 03/12/12 0554 03/15/12 0600  WBC  --  10.7* 7.5  HGB 15.6 15.0 14.4  HCT 46.0 44.8 42.2  MCV  --  92.9 90.9  PLT  --  230 230   Cardiac Enzymes:  Recent Labs Lab 03/11/12 2351 03/12/12 0554 03/12/12 1014  TROPONINI <0.30 <0.30 <0.30   BNP: BNP (last 3 results) No results found for this basename: PROBNP,  in the last 8760 hours CBG: No results found for this  basename: GLUCAP,  in the last 168 hours     Signed:  Rhetta Mura  [facility] 03/15/2012, 9:21 AM

## 2012-03-23 ENCOUNTER — Other Ambulatory Visit (HOSPITAL_COMMUNITY): Payer: Self-pay | Admitting: Cardiology

## 2012-03-23 DIAGNOSIS — R9431 Abnormal electrocardiogram [ECG] [EKG]: Secondary | ICD-10-CM

## 2012-03-31 ENCOUNTER — Encounter (HOSPITAL_COMMUNITY)
Admission: RE | Admit: 2012-03-31 | Discharge: 2012-03-31 | Disposition: A | Discharge status: Home / Self Care | Payer: Medicaid Other | Source: Ambulatory Visit | Attending: Cardiology | Admitting: Cardiology

## 2012-03-31 ENCOUNTER — Other Ambulatory Visit: Payer: Self-pay

## 2012-03-31 VITALS — BP 174/99

## 2012-03-31 DIAGNOSIS — R9431 Abnormal electrocardiogram [ECG] [EKG]: Secondary | ICD-10-CM | POA: Insufficient documentation

## 2012-03-31 DIAGNOSIS — I4891 Unspecified atrial fibrillation: Secondary | ICD-10-CM

## 2012-03-31 MED ORDER — TECHNETIUM TC 99M SESTAMIBI GENERIC - CARDIOLITE
10.0000 | Freq: Once | INTRAVENOUS | Status: AC | PRN
  Administered 2012-03-31: 10 via INTRAVENOUS

## 2012-03-31 MED ORDER — REGADENOSON 0.4 MG/5ML IV SOLN: 0.4000 mg | Freq: Once | INTRAVENOUS | Status: AC

## 2012-03-31 MED ORDER — TECHNETIUM TC 99M SESTAMIBI - CARDIOLITE
30.0000 | Freq: Once | INTRAVENOUS | Status: AC | PRN
  Administered 2012-03-31: 12:00:00 30 via INTRAVENOUS

## 2012-03-31 MED ORDER — REGADENOSON 0.4 MG/5ML IV SOLN
INTRAVENOUS | Status: AC
  Administered 2012-03-31: 0.4 mg via INTRAVENOUS
  Filled 2012-03-31: qty 5

## 2012-08-10 ENCOUNTER — Ambulatory Visit: Payer: Medicaid Other | Admitting: Endocrinology

## 2012-11-23 ENCOUNTER — Encounter: Payer: Self-pay | Admitting: *Deleted

## 2012-11-23 ENCOUNTER — Encounter: Payer: Self-pay | Admitting: Cardiology

## 2012-11-24 ENCOUNTER — Ambulatory Visit: Payer: Medicaid Other | Admitting: Cardiology

## 2012-12-05 ENCOUNTER — Ambulatory Visit (INDEPENDENT_AMBULATORY_CARE_PROVIDER_SITE_OTHER): Payer: Medicare Other | Admitting: Pharmacist

## 2012-12-05 DIAGNOSIS — I4891 Unspecified atrial fibrillation: Secondary | ICD-10-CM

## 2013-01-02 ENCOUNTER — Ambulatory Visit: Payer: Medicaid Other | Admitting: Cardiology

## 2013-09-11 DEATH — deceased

## 2014-04-16 ENCOUNTER — Telehealth: Payer: Self-pay | Admitting: *Deleted

## 2014-04-16 NOTE — Telephone Encounter (Signed)
Patient has not been seen in the [facility] for follow-up in over a year per our records.  Made multiple calls to the numbers listed and was unable to leave a message or contact anyone in reference to him taking Warfarin (coumadin).  I called his contact number and it was invalid. I called the alternate numbers- the number listed for the niece- a male answered & states it was the wrong number and the sister's number there was no answer.  Unable to leave a message on any telephone lines.
